# Patient Record
Sex: Female | Born: 1975 | Smoking: Never smoker
Health system: Southern US, Community
[De-identification: ages and names within clinical notes are randomized; demographics above are authoritative.]

## PROBLEM LIST (undated history)

## (undated) ENCOUNTER — Ambulatory Visit: Admission: EM | Source: Home / Self Care

---

## 2011-11-10 ENCOUNTER — Other Ambulatory Visit (HOSPITAL_COMMUNITY)
Admission: RE | Admit: 2011-11-10 | Discharge: 2011-11-10 | Disposition: A | Discharge status: Home / Self Care | Payer: BC Managed Care – PPO | Source: Ambulatory Visit | Attending: Family Medicine | Admitting: Family Medicine

## 2011-11-10 DIAGNOSIS — Z124 Encounter for screening for malignant neoplasm of cervix: Secondary | ICD-10-CM | POA: Insufficient documentation

## 2011-11-10 DIAGNOSIS — Z1159 Encounter for screening for other viral diseases: Secondary | ICD-10-CM | POA: Insufficient documentation

## 2015-09-15 ENCOUNTER — Other Ambulatory Visit (HOSPITAL_COMMUNITY)
Admission: RE | Admit: 2015-09-15 | Discharge: 2015-09-15 | Disposition: A | Discharge status: Home / Self Care | Payer: BLUE CROSS/BLUE SHIELD | Source: Ambulatory Visit | Attending: Obstetrics and Gynecology | Admitting: Obstetrics and Gynecology

## 2015-09-15 ENCOUNTER — Other Ambulatory Visit: Payer: Self-pay | Admitting: Obstetrics and Gynecology

## 2015-09-15 DIAGNOSIS — Z01419 Encounter for gynecological examination (general) (routine) without abnormal findings: Secondary | ICD-10-CM | POA: Diagnosis present

## 2015-09-15 DIAGNOSIS — Z1151 Encounter for screening for human papillomavirus (HPV): Secondary | ICD-10-CM | POA: Insufficient documentation

## 2015-09-16 LAB — CYTOLOGY - PAP

## 2017-09-20 ENCOUNTER — Other Ambulatory Visit: Payer: Self-pay | Admitting: Obstetrics and Gynecology

## 2020-04-19 ENCOUNTER — Other Ambulatory Visit (HOSPITAL_COMMUNITY)
Admission: RE | Admit: 2020-04-19 | Discharge: 2020-04-19 | Disposition: A | Discharge status: Home / Self Care | Payer: 59 | Source: Ambulatory Visit | Attending: Family Medicine | Admitting: Family Medicine

## 2020-04-19 ENCOUNTER — Other Ambulatory Visit: Payer: Self-pay | Admitting: Family Medicine

## 2020-04-19 DIAGNOSIS — Z124 Encounter for screening for malignant neoplasm of cervix: Secondary | ICD-10-CM | POA: Insufficient documentation

## 2020-04-22 ENCOUNTER — Other Ambulatory Visit: Payer: Self-pay | Admitting: Family Medicine

## 2020-04-22 DIAGNOSIS — Z1231 Encounter for screening mammogram for malignant neoplasm of breast: Secondary | ICD-10-CM

## 2020-04-23 LAB — CYTOLOGY - PAP
Comment: NEGATIVE
Diagnosis: NEGATIVE
High risk HPV: NEGATIVE

## 2020-06-17 ENCOUNTER — Ambulatory Visit
Admission: RE | Admit: 2020-06-17 | Discharge: 2020-06-17 | Disposition: A | Discharge status: Home / Self Care | Payer: BLUE CROSS/BLUE SHIELD | Source: Ambulatory Visit | Attending: Family Medicine | Admitting: Family Medicine

## 2020-06-17 ENCOUNTER — Other Ambulatory Visit: Payer: Self-pay

## 2020-06-17 DIAGNOSIS — Z1231 Encounter for screening mammogram for malignant neoplasm of breast: Secondary | ICD-10-CM

## 2021-06-27 ENCOUNTER — Other Ambulatory Visit: Payer: Self-pay | Admitting: *Deleted

## 2021-06-27 DIAGNOSIS — D509 Iron deficiency anemia, unspecified: Secondary | ICD-10-CM

## 2021-06-27 NOTE — Progress Notes (Signed)
New patient appt for 2/13 and labs. Orders for lab entered

## 2021-07-25 ENCOUNTER — Ambulatory Visit (HOSPITAL_BASED_OUTPATIENT_CLINIC_OR_DEPARTMENT_OTHER)
Admission: RE | Admit: 2021-07-25 | Discharge: 2021-07-25 | Disposition: A | Discharge status: Home / Self Care | Payer: 59 | Source: Ambulatory Visit | Attending: Nurse Practitioner | Admitting: Nurse Practitioner

## 2021-07-25 ENCOUNTER — Telehealth: Payer: Self-pay

## 2021-07-25 ENCOUNTER — Other Ambulatory Visit (HOSPITAL_BASED_OUTPATIENT_CLINIC_OR_DEPARTMENT_OTHER): Payer: Self-pay

## 2021-07-25 ENCOUNTER — Encounter: Payer: Self-pay | Admitting: Nurse Practitioner

## 2021-07-25 ENCOUNTER — Inpatient Hospital Stay: Payer: 59 | Attending: Nurse Practitioner | Admitting: Nurse Practitioner

## 2021-07-25 ENCOUNTER — Other Ambulatory Visit: Payer: Self-pay

## 2021-07-25 ENCOUNTER — Inpatient Hospital Stay: Payer: 59

## 2021-07-25 VITALS — BP 112/57 | HR 79 | Temp 97.9°F | Resp 20 | Ht 63.0 in | Wt 119.8 lb

## 2021-07-25 DIAGNOSIS — D509 Iron deficiency anemia, unspecified: Secondary | ICD-10-CM | POA: Insufficient documentation

## 2021-07-25 DIAGNOSIS — D563 Thalassemia minor: Secondary | ICD-10-CM | POA: Insufficient documentation

## 2021-07-25 DIAGNOSIS — R194 Change in bowel habit: Secondary | ICD-10-CM | POA: Insufficient documentation

## 2021-07-25 DIAGNOSIS — Z809 Family history of malignant neoplasm, unspecified: Secondary | ICD-10-CM | POA: Diagnosis not present

## 2021-07-25 DIAGNOSIS — D569 Thalassemia, unspecified: Secondary | ICD-10-CM

## 2021-07-25 LAB — SAVE SMEAR(SSMR), FOR PROVIDER SLIDE REVIEW

## 2021-07-25 LAB — CBC WITH DIFFERENTIAL (CANCER CENTER ONLY)
Abs Immature Granulocytes: 0.03 10*3/uL (ref 0.00–0.07)
Basophils Absolute: 0.1 10*3/uL (ref 0.0–0.1)
Basophils Relative: 1 %
Eosinophils Absolute: 0.1 10*3/uL (ref 0.0–0.5)
Eosinophils Relative: 2 %
HCT: 27.8 % — ABNORMAL LOW (ref 36.0–46.0)
Hemoglobin: 7.9 g/dL — ABNORMAL LOW (ref 12.0–15.0)
Immature Granulocytes: 1 %
Lymphocytes Relative: 33 %
Lymphs Abs: 2 10*3/uL (ref 0.7–4.0)
MCH: 19.5 pg — ABNORMAL LOW (ref 26.0–34.0)
MCHC: 28.4 g/dL — ABNORMAL LOW (ref 30.0–36.0)
MCV: 68.5 fL — ABNORMAL LOW (ref 80.0–100.0)
Monocytes Absolute: 0.5 10*3/uL (ref 0.1–1.0)
Monocytes Relative: 8 %
Neutro Abs: 3.4 10*3/uL (ref 1.7–7.7)
Neutrophils Relative %: 55 %
Platelet Count: 393 10*3/uL (ref 150–400)
RBC: 4.06 MIL/uL (ref 3.87–5.11)
RDW: 16.3 % — ABNORMAL HIGH (ref 11.5–15.5)
WBC Count: 6 10*3/uL (ref 4.0–10.5)
nRBC: 0 % (ref 0.0–0.2)

## 2021-07-25 LAB — URINALYSIS, COMPLETE (UACMP) WITH MICROSCOPIC
Bilirubin Urine: NEGATIVE
Glucose, UA: NEGATIVE mg/dL
Hgb urine dipstick: NEGATIVE
Ketones, ur: NEGATIVE mg/dL
Leukocytes,Ua: NEGATIVE
Nitrite: NEGATIVE
Protein, ur: NEGATIVE mg/dL
Specific Gravity, Urine: 1.005 (ref 1.005–1.030)
pH: 7 (ref 5.0–8.0)

## 2021-07-25 LAB — FERRITIN: Ferritin: 3 ng/mL — ABNORMAL LOW (ref 11–307)

## 2021-07-25 NOTE — Telephone Encounter (Signed)
Called and spoke with the patient to let her know to start ferrous sulfate 325 mg twice daily Per Ebbie Ridge. Thomas. Patient voiced understanding of instruction and had no further questions or concerns at this time.

## 2021-07-25 NOTE — Progress Notes (Signed)
New Hematology/Oncology Consult   Requesting MD: Dr. Milagros Evener  [phone]  Reason for Consult: Iron deficiency anemia  HPI: Latoya Bartlett is a 46 year old woman referred for evaluation of iron deficiency anemia.  CBC from 06/15/2021 returned with a hemoglobin of 7.9, MCV 64, RDW 16, normal white count and platelets; ferritin low at 2.5 (11-306), TIBC elevated at 590, iron low at 28, iron saturation low at 5%, transferrin elevated at 422.  She was able to access additional labs from December 2021-hemoglobin 10, MCV 76, RDW 13.9.  Past medical history: Thalassemia trait  Current Outpatient Medications:    Cyanocobalamin (VITAMIN B 12) 100 MCG LOZG, , Disp: , Rfl: :  FH: Mother is 6 with thyroid problems, father deceased in an accident, 2 brothers in good health, paternal uncle with "cancer".  SOCIAL HISTORY: She lives in [place].  She is married.  She has 2 healthy children.  She is originally from [place].  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  No tobacco or alcohol use.  Review of Systems: She is a vegan.  She feels very fatigued.  She has a monthly menstrual cycle which she describes as "light" lasting 1-1/2 to 2 days.  She is not aware of other bleeding.  Nails are brittle.  Mouth is very dry.  She intermittently has ulcers on her tongue.  No fevers or sweats.  No weight loss.  Recent joint pains, mainly knees.  She has dyspnea on exertion.  No cough.  No dysphagia.  No nausea or vomiting.  3 to 4 months ago she began having a bowel movement after eating with stool watery on an intermittent basis.  This occurs after at least 2 meals a day.  No black stools.  She does note that stools have developed a "foul odor".  By the end of the day abdomen feels swollen.  She has never had a colonoscopy.  No hematuria.  Physical Exam:  Blood pressure (!) 112/57, pulse 79, temperature 97.9 F (36.6 C), temperature source Oral, resp. rate 20, height 5\' 3"  (1.6 m), weight 119 lb 12.8 oz (54.3 kg),  SpO2 98 %.  HEENT: Conjunctival pallor.  No thrush or ulcers. Lungs: Lungs clear bilaterally. Cardiac: Regular rate and rhythm. Abdomen: Abdomen soft and nontender.  Abdomen seems distended.  No hepatosplenomegaly. Vascular: No leg edema. Lymph nodes: No palpable cervical, supraclavicular, axillary or inguinal lymph nodes. Neurologic: Alert and oriented. Skin: Warm and dry.  No rash.  LABS:   Recent Labs    07/25/21 1343  WBC 6.0  HGB 7.9*  HCT 27.8*  PLT 393  Peripheral blood smear-few "C" cells, numerous ovalocytes, few teardrops, marked variation in red cell size, hypochromic and microcytic red cells, rare cigar cell, rare target cells; white blood cell morphology unremarkable; platelets normal in number  Assessment and Plan:   Microcytic anemia due to iron deficiency, thalassemia likely contributing Thalassemia Change in bowel habits 3 to 4 months ago  Latoya Bartlett has been referred for evaluation of iron deficiency anemia.  She has a microcytic anemia.  Iron studies are consistent with iron deficiency.  She also reports having thalassemia which is likely contributing.  She will begin ferrous sulfate 325 mg twice daily.  She will contact the office if she is unable to tolerate the oral iron.  There is no obvious source of blood loss.  Her menstrual cycle is very light.  She will complete stool cards and submit a urinalysis.  We will obtain a hemoglobin electrophoresis.  At  today's visit she reports a change in bowel habits 3 to 4 months ago.  On exam the abdomen seems distended.  We are sending her for an abdominal x-ray.  We made a referral to Dr. Therisa Doyne for further evaluation.  She will return for lab and follow-up in approximately 4 weeks.  She will contact the office in the interim as outlined above or with any other problems.  Patient seen with Dr. Benay Spice.    Ned Card, NP 07/25/2021, 2:45 PM   This was a shared visit with Ned Card.  46s. Bartlett was  interviewed and examined.  I reviewed the peripheral blood smear.  She is referred for evaluation of microcytic anemia.  She has iron deficiency anemia.  She reports a history of "thalassemia" trait, but we do not see significant targeting on the blood smear today.  We will check a hemoglobin electrophoresis.  She reports irregular bowel habits for the past 6 months.  I am concerned she could have chronic GI blood loss.  We will refer her to Dr. Deno Etienne to consider an endoscopic evaluation.  We will refer her for an abdominal x-ray to evaluate the abdominal distention.  I was present for greater than 50% today's visit.  I performed medical decision making.  Latoya Manson, MD

## 2021-07-25 NOTE — Progress Notes (Signed)
Urgent referral placed to [facility] with Dr Marca Ancona per Lonna Cobb NP, voicemail on referral coordinator for [facility] and will fax referral this afternoon to [phone]

## 2021-07-27 ENCOUNTER — Encounter: Payer: Self-pay | Admitting: *Deleted

## 2021-07-27 DIAGNOSIS — D509 Iron deficiency anemia, unspecified: Secondary | ICD-10-CM | POA: Diagnosis not present

## 2021-07-27 DIAGNOSIS — R194 Change in bowel habit: Secondary | ICD-10-CM | POA: Diagnosis not present

## 2021-07-27 DIAGNOSIS — D563 Thalassemia minor: Secondary | ICD-10-CM | POA: Insufficient documentation

## 2021-07-27 LAB — HGB FRACTIONATION CASCADE
Hgb A2: 1.9 % (ref 1.8–3.2)
Hgb A: 98.1 % (ref 96.4–98.8)
Hgb F: 0 % (ref 0.0–2.0)
Hgb S: 0 %

## 2021-07-27 NOTE — Progress Notes (Signed)
Faxed demographics, chart information to [facility] for urgent referral [phone].

## 2021-07-28 ENCOUNTER — Telehealth: Payer: Self-pay | Admitting: *Deleted

## 2021-07-28 DIAGNOSIS — D509 Iron deficiency anemia, unspecified: Secondary | ICD-10-CM | POA: Diagnosis not present

## 2021-07-29 DIAGNOSIS — D509 Iron deficiency anemia, unspecified: Secondary | ICD-10-CM | POA: Diagnosis not present

## 2021-08-01 ENCOUNTER — Other Ambulatory Visit (HOSPITAL_BASED_OUTPATIENT_CLINIC_OR_DEPARTMENT_OTHER): Payer: Self-pay

## 2021-08-01 DIAGNOSIS — D509 Iron deficiency anemia, unspecified: Secondary | ICD-10-CM

## 2021-08-01 LAB — OCCULT BLOOD X 1 CARD TO LAB, STOOL
Fecal Occult Bld: NEGATIVE
Fecal Occult Bld: NEGATIVE
Fecal Occult Bld: NEGATIVE

## 2021-08-04 NOTE — Telephone Encounter (Signed)
Verified that patient has appt with [facility] with Dr Marca Ancona on 08/10/21

## 2021-08-22 ENCOUNTER — Inpatient Hospital Stay: Payer: 59 | Attending: Oncology | Admitting: Oncology

## 2021-08-22 ENCOUNTER — Inpatient Hospital Stay (HOSPITAL_BASED_OUTPATIENT_CLINIC_OR_DEPARTMENT_OTHER): Payer: 59 | Admitting: Oncology

## 2021-08-22 ENCOUNTER — Other Ambulatory Visit: Payer: Self-pay

## 2021-08-22 VITALS — BP 98/64 | HR 77 | Temp 98.0°F | Resp 20 | Ht 63.0 in | Wt 119.2 lb

## 2021-08-22 DIAGNOSIS — D509 Iron deficiency anemia, unspecified: Secondary | ICD-10-CM | POA: Insufficient documentation

## 2021-08-22 DIAGNOSIS — D569 Thalassemia, unspecified: Secondary | ICD-10-CM | POA: Insufficient documentation

## 2021-08-22 DIAGNOSIS — R194 Change in bowel habit: Secondary | ICD-10-CM | POA: Insufficient documentation

## 2021-08-22 LAB — CBC WITH DIFFERENTIAL (CANCER CENTER ONLY)
Abs Immature Granulocytes: 0.01 10*3/uL (ref 0.00–0.07)
Basophils Absolute: 0.1 10*3/uL (ref 0.0–0.1)
Basophils Relative: 1 %
Eosinophils Absolute: 0.1 10*3/uL (ref 0.0–0.5)
Eosinophils Relative: 2 %
HCT: 32.6 % — ABNORMAL LOW (ref 36.0–46.0)
Hemoglobin: 9.5 g/dL — ABNORMAL LOW (ref 12.0–15.0)
Immature Granulocytes: 0 %
Lymphocytes Relative: 35 %
Lymphs Abs: 1.7 10*3/uL (ref 0.7–4.0)
MCH: 21 pg — ABNORMAL LOW (ref 26.0–34.0)
MCHC: 29.1 g/dL — ABNORMAL LOW (ref 30.0–36.0)
MCV: 72.1 fL — ABNORMAL LOW (ref 80.0–100.0)
Monocytes Absolute: 0.5 10*3/uL (ref 0.1–1.0)
Monocytes Relative: 10 %
Neutro Abs: 2.4 10*3/uL (ref 1.7–7.7)
Neutrophils Relative %: 52 %
Platelet Count: 402 10*3/uL — ABNORMAL HIGH (ref 150–400)
RBC: 4.52 MIL/uL (ref 3.87–5.11)
RDW: 22.2 % — ABNORMAL HIGH (ref 11.5–15.5)
WBC Count: 4.7 10*3/uL (ref 4.0–10.5)
nRBC: 0 % (ref 0.0–0.2)

## 2021-08-22 LAB — FERRITIN: Ferritin: 8 ng/mL — ABNORMAL LOW (ref 11–307)

## 2021-08-22 NOTE — Progress Notes (Signed)
?  [facility] ?OFFICE PROGRESS NOTE ? ? ?Diagnosis: Anemia ? ?INTERVAL HISTORY:  ? ?Latoya Bartlett returns as scheduled.  She is taking iron once daily.  She tolerated khaki and is scheduled for upper and lower endoscopies later this week.  She reports partial improvement in malaise since beginning iron.  She continues to have a menstrual cycle.  No other bleeding.  She has abdominal distention prior to menses. ? ?Objective: ? ?Vital signs in last 24 hours: ? ?Blood pressure 98/64, pulse 77, temperature 98 ?F (36.7 ?C), temperature source Oral, resp. rate 20, height 5\' 3"  (1.6 m), weight 119 lb 3.2 oz (54.1 kg), SpO2 100 %. ?  ? ?Resp: Lungs clear bilaterally ?Cardio: Regular rate and rhythm ?GI: No hepatosplenomegaly, nontender ?Vascular: No leg edema ? ? ?Lab Results: ? ?Lab Results  ?Component Value Date  ? WBC 4.7 08/22/2021  ? HGB 9.5 (L) 08/22/2021  ? HCT 32.6 (L) 08/22/2021  ? MCV 72.1 (L) 08/22/2021  ? PLT 402 (H) 08/22/2021  ? NEUTROABS 2.4 08/22/2021  ? ? ? ?Medications: I have reviewed the patient's current medications. ? ? ?Assessment/Plan: ?Microcytic anemia due to iron deficiency, thalassemia likely contributing ?Thalassemia ?Change in bowel habits 3 to 4 months ago-evaluated by Dr. Deno Etienne and scheduled for upper and lower endoscopies ? ? ? ?Disposition: ?The hemoglobin has improved since she began iron therapy.  She will try to increase the iron to twice daily.  [name] will return for an office visit and CBC in 2 months. ? ?She reports a family member has alpha thalassemia.  The hemoglobin electrophoresis 07/25/2021 did not reveal an elevated hemoglobin A 2 level.  We will check alpha globin gene testing when she returns in 2 months. ? ?Betsy Coder, MD ? ?08/22/2021  ?8:16 AM ? ? ?

## 2021-08-26 ENCOUNTER — Telehealth: Payer: Self-pay

## 2021-08-26 NOTE — Telephone Encounter (Signed)
-----   Message from Ladene Artist, MD sent at 08/25/2021  8:06 PM EDT ----- ?Please call patient, iron level is still low, increase ferrous sulfate to twice daily ? ?

## 2021-08-26 NOTE — Progress Notes (Addendum)
TC to Pt no answer v/m not able to leave message. Will try again today. Second call to Pt. Pt verbalized understanding. ?

## 2021-08-26 NOTE — Telephone Encounter (Signed)
Verbal read back of instructions to take iron twice daily. Pt verbalized understanding. ?

## 2021-08-29 ENCOUNTER — Telehealth: Payer: Self-pay

## 2021-09-29 ENCOUNTER — Telehealth: Payer: Self-pay

## 2021-09-29 ENCOUNTER — Other Ambulatory Visit: Payer: Self-pay

## 2021-09-29 DIAGNOSIS — D509 Iron deficiency anemia, unspecified: Secondary | ICD-10-CM

## 2021-09-29 NOTE — Telephone Encounter (Signed)
TC from Pt stating she is having shortness of breath and loss of voice since she has been taking ferrous sulfate 325 mg twice daily. Pt stated she stopped taking the medication and wanted to inform Dr Truett Perna discussed with provider who stated he doesn't think her losing her voice or shortness of breath is from the iron. Lab appointment scheduled to check Pt's HGB.  ?

## 2021-09-30 ENCOUNTER — Inpatient Hospital Stay: Payer: 59 | Attending: Oncology

## 2021-09-30 DIAGNOSIS — D509 Iron deficiency anemia, unspecified: Secondary | ICD-10-CM | POA: Diagnosis present

## 2021-09-30 LAB — CBC WITH DIFFERENTIAL (CANCER CENTER ONLY)
Abs Immature Granulocytes: 0.01 10*3/uL (ref 0.00–0.07)
Basophils Absolute: 0.1 10*3/uL (ref 0.0–0.1)
Basophils Relative: 1 %
Eosinophils Absolute: 0.1 10*3/uL (ref 0.0–0.5)
Eosinophils Relative: 2 %
HCT: 33.6 % — ABNORMAL LOW (ref 36.0–46.0)
Hemoglobin: 10.3 g/dL — ABNORMAL LOW (ref 12.0–15.0)
Immature Granulocytes: 0 %
Lymphocytes Relative: 32 %
Lymphs Abs: 1.8 10*3/uL (ref 0.7–4.0)
MCH: 23.8 pg — ABNORMAL LOW (ref 26.0–34.0)
MCHC: 30.7 g/dL (ref 30.0–36.0)
MCV: 77.8 fL — ABNORMAL LOW (ref 80.0–100.0)
Monocytes Absolute: 0.5 10*3/uL (ref 0.1–1.0)
Monocytes Relative: 9 %
Neutro Abs: 3.1 10*3/uL (ref 1.7–7.7)
Neutrophils Relative %: 56 %
Platelet Count: 298 10*3/uL (ref 150–400)
RBC: 4.32 MIL/uL (ref 3.87–5.11)
RDW: 20.5 % — ABNORMAL HIGH (ref 11.5–15.5)
WBC Count: 5.5 10*3/uL (ref 4.0–10.5)
nRBC: 0 % (ref 0.0–0.2)

## 2021-10-07 ENCOUNTER — Telehealth: Payer: Self-pay

## 2021-10-07 NOTE — Telephone Encounter (Signed)
Pt called requesting information about lab results lab results giving Pt inquiring about IV iron instead of taking oral iron. Dr. Truett Perna informed. ?

## 2021-10-18 NOTE — Addendum Note (Signed)
Addended by: Tyson Dense B on: 10/18/2021 01:52 PM ? ? Modules accepted: Orders ? ?

## 2021-10-25 ENCOUNTER — Encounter: Payer: Self-pay | Admitting: Nurse Practitioner

## 2021-10-25 ENCOUNTER — Inpatient Hospital Stay: Payer: 59 | Attending: Oncology

## 2021-10-25 ENCOUNTER — Inpatient Hospital Stay (HOSPITAL_BASED_OUTPATIENT_CLINIC_OR_DEPARTMENT_OTHER): Payer: 59 | Admitting: Nurse Practitioner

## 2021-10-25 VITALS — BP 117/58 | HR 78 | Temp 98.1°F | Resp 18 | Ht 63.0 in | Wt 122.8 lb

## 2021-10-25 DIAGNOSIS — R194 Change in bowel habit: Secondary | ICD-10-CM | POA: Insufficient documentation

## 2021-10-25 DIAGNOSIS — D509 Iron deficiency anemia, unspecified: Secondary | ICD-10-CM | POA: Insufficient documentation

## 2021-10-25 DIAGNOSIS — D569 Thalassemia, unspecified: Secondary | ICD-10-CM | POA: Insufficient documentation

## 2021-10-25 LAB — CBC WITH DIFFERENTIAL (CANCER CENTER ONLY)
Abs Immature Granulocytes: 0.01 10*3/uL (ref 0.00–0.07)
Basophils Absolute: 0 10*3/uL (ref 0.0–0.1)
Basophils Relative: 1 %
Eosinophils Absolute: 0.1 10*3/uL (ref 0.0–0.5)
Eosinophils Relative: 2 %
HCT: 32.3 % — ABNORMAL LOW (ref 36.0–46.0)
Hemoglobin: 10 g/dL — ABNORMAL LOW (ref 12.0–15.0)
Immature Granulocytes: 0 %
Lymphocytes Relative: 34 %
Lymphs Abs: 1.9 10*3/uL (ref 0.7–4.0)
MCH: 24.6 pg — ABNORMAL LOW (ref 26.0–34.0)
MCHC: 31 g/dL (ref 30.0–36.0)
MCV: 79.4 fL — ABNORMAL LOW (ref 80.0–100.0)
Monocytes Absolute: 0.5 10*3/uL (ref 0.1–1.0)
Monocytes Relative: 9 %
Neutro Abs: 3 10*3/uL (ref 1.7–7.7)
Neutrophils Relative %: 54 %
Platelet Count: 303 10*3/uL (ref 150–400)
RBC: 4.07 MIL/uL (ref 3.87–5.11)
RDW: 16.2 % — ABNORMAL HIGH (ref 11.5–15.5)
WBC Count: 5.5 10*3/uL (ref 4.0–10.5)
nRBC: 0 % (ref 0.0–0.2)

## 2021-10-25 LAB — FERRITIN: Ferritin: 4 ng/mL — ABNORMAL LOW (ref 11–307)

## 2021-10-25 NOTE — Progress Notes (Signed)
?  [facility] ?OFFICE PROGRESS NOTE ? ? ?Diagnosis: Anemia ? ?INTERVAL HISTORY:  ? ?Ms. Maggi returns as scheduled.  She is no longer taking oral iron.  She contacted the office on 09/29/2021 to report shortness of breath and loss of voice.  She attributed the symptoms to oral iron.  She has noted some improvement but continues to have hoarseness.  She has seen ENT for the hoarseness predating the start of oral iron.  She feels the oral iron worsened the hoarseness.  She continues to have a menstrual cycle.  No other bleeding. ? ? ? ?Objective: ? ?Vital signs in last 24 hours: ? ?Blood pressure (!) 117/58, pulse 78, temperature 98.1 ?F (36.7 ?C), temperature source Oral, resp. rate 18, height 5\' 3"  (1.6 m), weight 122 lb 12.8 oz (55.7 kg), SpO2 100 %. ?  ? ?Resp: Lungs clear bilaterally. ?Cardio: Regular rate and rhythm. ?GI: Abdomen soft and nontender.  No hepatosplenomegaly. ?Vascular: No leg edema. ? ? ?Lab Results: ? ?Lab Results  ?Component Value Date  ? WBC 5.5 10/25/2021  ? HGB 10.0 (L) 10/25/2021  ? HCT 32.3 (L) 10/25/2021  ? MCV 79.4 (L) 10/25/2021  ? PLT 303 10/25/2021  ? NEUTROABS 3.0 10/25/2021  ? ? ?Imaging: ? ?No results found. ? ?Medications: I have reviewed the patient's current medications. ? ?Assessment/Plan: ?Microcytic anemia due to iron deficiency, thalassemia likely contributing ?Thalassemia ?Change in bowel habits 3 to 4 months ago-evaluated by Dr. 10/27/2021, underwent upper and lower endoscopy ? ?Disposition: Ms. Mckinlay has a microcytic anemia due to iron deficiency, thalassemia likely contributing.  She discontinued oral iron several weeks ago due to concern regarding increased hoarseness and shortness of breath.  We discussed a different oral iron preparation versus a trial of IV iron.  We discussed the risk for an allergic or anaphylactic reaction with IV iron.  She is uncomfortable taking oral iron or IV iron.  She will try dietary manipulations.  We will follow-up on  the [place] globin gene testing from today.  She will return for a CBC and follow-up visit in 4 weeks. ? ?She will continue follow-up with ENT regarding the hoarseness. ? ? ? ?Noralee Space ANP/GNP-BC  ? ?10/25/2021  ?8:18 AM ? ? ? ? ? ? ? ?

## 2021-11-03 LAB — ALPHA-THALASSEMIA GENOTYPR

## 2021-11-25 ENCOUNTER — Other Ambulatory Visit: Payer: Self-pay | Admitting: *Deleted

## 2021-11-25 DIAGNOSIS — D509 Iron deficiency anemia, unspecified: Secondary | ICD-10-CM

## 2021-11-28 ENCOUNTER — Inpatient Hospital Stay: Payer: 59 | Attending: Oncology

## 2021-11-28 ENCOUNTER — Inpatient Hospital Stay (HOSPITAL_BASED_OUTPATIENT_CLINIC_OR_DEPARTMENT_OTHER): Payer: 59 | Admitting: Oncology

## 2021-11-28 ENCOUNTER — Other Ambulatory Visit: Payer: Self-pay

## 2021-11-28 VITALS — BP 108/69 | HR 82 | Temp 98.2°F | Resp 18 | Ht 63.0 in | Wt 120.4 lb

## 2021-11-28 DIAGNOSIS — D509 Iron deficiency anemia, unspecified: Secondary | ICD-10-CM

## 2021-11-28 DIAGNOSIS — R194 Change in bowel habit: Secondary | ICD-10-CM | POA: Diagnosis not present

## 2021-11-28 DIAGNOSIS — D563 Thalassemia minor: Secondary | ICD-10-CM | POA: Insufficient documentation

## 2021-11-28 LAB — CBC WITH DIFFERENTIAL (CANCER CENTER ONLY)
Abs Immature Granulocytes: 0.02 10*3/uL (ref 0.00–0.07)
Basophils Absolute: 0 10*3/uL (ref 0.0–0.1)
Basophils Relative: 1 %
Eosinophils Absolute: 0.1 10*3/uL (ref 0.0–0.5)
Eosinophils Relative: 1 %
HCT: 33.5 % — ABNORMAL LOW (ref 36.0–46.0)
Hemoglobin: 10.4 g/dL — ABNORMAL LOW (ref 12.0–15.0)
Immature Granulocytes: 0 %
Lymphocytes Relative: 28 %
Lymphs Abs: 1.8 10*3/uL (ref 0.7–4.0)
MCH: 24.8 pg — ABNORMAL LOW (ref 26.0–34.0)
MCHC: 31 g/dL (ref 30.0–36.0)
MCV: 79.8 fL — ABNORMAL LOW (ref 80.0–100.0)
Monocytes Absolute: 0.5 10*3/uL (ref 0.1–1.0)
Monocytes Relative: 7 %
Neutro Abs: 4 10*3/uL (ref 1.7–7.7)
Neutrophils Relative %: 63 %
Platelet Count: 370 10*3/uL (ref 150–400)
RBC: 4.2 MIL/uL (ref 3.87–5.11)
RDW: 13.4 % (ref 11.5–15.5)
WBC Count: 6.4 10*3/uL (ref 4.0–10.5)
nRBC: 0 % (ref 0.0–0.2)

## 2021-11-28 LAB — FERRITIN: Ferritin: 8 ng/mL — ABNORMAL LOW (ref 11–307)

## 2021-11-28 NOTE — Progress Notes (Signed)
  [facility] OFFICE PROGRESS NOTE   Diagnosis: Iron deficiency anemia  INTERVAL HISTORY:   Latoya Bartlett returns as scheduled.  She is not taking iron.  She has a menstrual cycle.  No other bleeding.  She had a "cold "a fever, sinus congestion, and cough last week.  The symptoms have improved.  No dyspnea.  Objective:  Vital signs in last 24 hours:  Blood pressure 108/69, pulse 82, temperature 98.2 F (36.8 C), temperature source Oral, resp. rate 18, height 5\' 3"  (1.6 m), weight 120 lb 6.4 oz (54.6 kg), SpO2 100 %.    Resp: Lungs clear bilaterally Cardio: Regular rate and rhythm GI: No hepatosplenomegaly, nontender Vascular: No leg edema     Lab Results:  Lab Results  Component Value Date   WBC 6.4 11/28/2021   HGB 10.4 (L) 11/28/2021   HCT 33.5 (L) 11/28/2021   MCV 79.8 (L) 11/28/2021   PLT 370 11/28/2021   NEUTROABS 4.0 11/28/2021   Medications: I have reviewed the patient's current medications.   Assessment/Plan: Microcytic anemia due to iron deficiency Report of thalassemia trait [place] globin gene testing - 10/25/2021 Change in bowel habits 3 to 4 months ago-evaluated by Dr. 10/27/2021, underwent upper and lower endoscopy    Disposition: Latoya Bartlett has persistent microcytic anemia, though the hemoglobin has improved over the past several months.  The anemia appears to be related to iron deficiency.  We will follow-up on the ferritin level from today.  She agrees to resume ferrous sulfate twice daily.  She developed hoarseness when on iron in the past.  This has resolved.  She will call for recurrent hoarseness.  She will return for an office and lab visit in 6 weeks.  Noralee Space, MD  11/28/2021  9:08 AM

## 2021-11-30 ENCOUNTER — Telehealth: Payer: Self-pay

## 2021-11-30 NOTE — Progress Notes (Signed)
Two calls to Pt. To relay results not able to leave message voicemail not set up

## 2021-11-30 NOTE — Telephone Encounter (Signed)
Called the patient to give result

## 2021-12-01 ENCOUNTER — Telehealth: Payer: Self-pay

## 2021-12-01 NOTE — Telephone Encounter (Signed)
-----   Message from Ladene Artist, MD sent at 11/28/2021  5:20 PM EDT ----- Please call patient, iron level remains low, begin ferrous sulfate as scheduled, follow-up as scheduled

## 2021-12-01 NOTE — Telephone Encounter (Signed)
Pt verbalized understanding.

## 2021-12-07 LAB — ALPHA-THALASSEMIA GENOTYPR

## 2022-01-09 ENCOUNTER — Inpatient Hospital Stay: Payer: 59 | Attending: Oncology

## 2022-01-09 ENCOUNTER — Inpatient Hospital Stay: Payer: 59 | Admitting: Nurse Practitioner

## 2022-01-10 ENCOUNTER — Encounter: Payer: Self-pay | Admitting: Nurse Practitioner

## 2022-01-10 ENCOUNTER — Inpatient Hospital Stay: Payer: 59 | Attending: Oncology | Admitting: Nurse Practitioner

## 2022-01-10 ENCOUNTER — Inpatient Hospital Stay: Payer: 59

## 2022-01-10 VITALS — BP 109/73 | HR 74 | Temp 97.9°F | Resp 18 | Ht 63.0 in | Wt 119.8 lb

## 2022-01-10 DIAGNOSIS — D509 Iron deficiency anemia, unspecified: Secondary | ICD-10-CM | POA: Diagnosis present

## 2022-01-10 DIAGNOSIS — K5900 Constipation, unspecified: Secondary | ICD-10-CM | POA: Insufficient documentation

## 2022-01-10 LAB — CBC WITH DIFFERENTIAL (CANCER CENTER ONLY)
Abs Immature Granulocytes: 0.03 10*3/uL (ref 0.00–0.07)
Basophils Absolute: 0.1 10*3/uL (ref 0.0–0.1)
Basophils Relative: 1 %
Eosinophils Absolute: 0.1 10*3/uL (ref 0.0–0.5)
Eosinophils Relative: 2 %
HCT: 36.6 % (ref 36.0–46.0)
Hemoglobin: 11.6 g/dL — ABNORMAL LOW (ref 12.0–15.0)
Immature Granulocytes: 0 %
Lymphocytes Relative: 21 %
Lymphs Abs: 1.6 10*3/uL (ref 0.7–4.0)
MCH: 26.4 pg (ref 26.0–34.0)
MCHC: 31.7 g/dL (ref 30.0–36.0)
MCV: 83.4 fL (ref 80.0–100.0)
Monocytes Absolute: 0.6 10*3/uL (ref 0.1–1.0)
Monocytes Relative: 7 %
Neutro Abs: 5.4 10*3/uL (ref 1.7–7.7)
Neutrophils Relative %: 69 %
Platelet Count: 361 10*3/uL (ref 150–400)
RBC: 4.39 MIL/uL (ref 3.87–5.11)
RDW: 14.5 % (ref 11.5–15.5)
WBC Count: 7.8 10*3/uL (ref 4.0–10.5)
nRBC: 0 % (ref 0.0–0.2)

## 2022-01-10 LAB — FERRITIN: Ferritin: 18 ng/mL (ref 11–307)

## 2022-01-10 NOTE — Progress Notes (Signed)
  [facility] OFFICE PROGRESS NOTE   Diagnosis: Iron deficiency anemia  INTERVAL HISTORY:   Latoya Bartlett returns as scheduled.  She tried taking oral iron twice a day following office visit 11/28/2021.  She was unable to tolerate due to constipation.  She is able to tolerate 1 iron tablet a day.  No bleeding except related to the monthly menstrual cycle.  She did not develop recurrent hoarseness when she resumed oral iron.  She notes that she is feeling better coinciding with improvement in the hemoglobin.  Objective:  Vital signs in last 24 hours:  Blood pressure 109/73, pulse 74, temperature 97.9 F (36.6 C), temperature source Oral, resp. rate 18, height 5\' 3"  (1.6 m), weight 119 lb 12.8 oz (54.3 kg), SpO2 100 %.    HEENT: No thrush or ulcers. Resp: Lungs clear bilaterally. Cardio: Regular rate and rhythm. GI: Abdomen soft and nontender.  No hepatosplenomegaly. Vascular: No leg edema.  Lab Results:  Lab Results  Component Value Date   WBC 7.8 01/10/2022   HGB 11.6 (L) 01/10/2022   HCT 36.6 01/10/2022   MCV 83.4 01/10/2022   PLT 361 01/10/2022   NEUTROABS 5.4 01/10/2022    Imaging:  No results found.  Medications: I have reviewed the patient's current medications.  Assessment/Plan: Microcytic anemia due to iron deficiency 07/25/2021 urinalysis negative for hemoglobin 07/27/2021 stool cards negative for occult blood Report of thalassemia trait [place] globin gene testing - 10/25/2021 no deletions detected Change in bowel habits 3 to 4 months ago-evaluated by Dr. 10/27/2021, underwent upper and lower endoscopy  Disposition: Latoya Bartlett appears stable.  The hemoglobin has improved with oral iron.  We will follow-up on the ferritin from today.  She will continue oral iron once daily.  She will return for lab and follow-up in approximately 6 weeks.    Noralee Space ANP/GNP-BC   01/10/2022  9:10 AM

## 2022-02-27 ENCOUNTER — Ambulatory Visit: Payer: 59 | Admitting: Oncology

## 2022-02-27 ENCOUNTER — Other Ambulatory Visit: Payer: 59

## 2022-02-27 ENCOUNTER — Ambulatory Visit: Payer: 59 | Admitting: Nurse Practitioner

## 2022-03-06 ENCOUNTER — Inpatient Hospital Stay: Payer: 59 | Attending: Oncology | Admitting: Nurse Practitioner

## 2022-03-06 ENCOUNTER — Inpatient Hospital Stay: Payer: 59

## 2022-03-06 ENCOUNTER — Encounter: Payer: Self-pay | Admitting: Nurse Practitioner

## 2022-03-06 VITALS — BP 109/66 | HR 86 | Temp 98.1°F | Resp 20 | Ht 63.0 in | Wt 119.6 lb

## 2022-03-06 DIAGNOSIS — D563 Thalassemia minor: Secondary | ICD-10-CM | POA: Diagnosis not present

## 2022-03-06 DIAGNOSIS — D509 Iron deficiency anemia, unspecified: Secondary | ICD-10-CM | POA: Diagnosis present

## 2022-03-06 LAB — CBC WITH DIFFERENTIAL (CANCER CENTER ONLY)
Abs Immature Granulocytes: 0.01 10*3/uL (ref 0.00–0.07)
Basophils Absolute: 0 10*3/uL (ref 0.0–0.1)
Basophils Relative: 1 %
Eosinophils Absolute: 0.1 10*3/uL (ref 0.0–0.5)
Eosinophils Relative: 2 %
HCT: 34.9 % — ABNORMAL LOW (ref 36.0–46.0)
Hemoglobin: 11.5 g/dL — ABNORMAL LOW (ref 12.0–15.0)
Immature Granulocytes: 0 %
Lymphocytes Relative: 26 %
Lymphs Abs: 1.7 10*3/uL (ref 0.7–4.0)
MCH: 27.5 pg (ref 26.0–34.0)
MCHC: 33 g/dL (ref 30.0–36.0)
MCV: 83.5 fL (ref 80.0–100.0)
Monocytes Absolute: 0.4 10*3/uL (ref 0.1–1.0)
Monocytes Relative: 7 %
Neutro Abs: 4.1 10*3/uL (ref 1.7–7.7)
Neutrophils Relative %: 64 %
Platelet Count: 323 10*3/uL (ref 150–400)
RBC: 4.18 MIL/uL (ref 3.87–5.11)
RDW: 13.1 % (ref 11.5–15.5)
WBC Count: 6.3 10*3/uL (ref 4.0–10.5)
nRBC: 0 % (ref 0.0–0.2)

## 2022-03-06 LAB — FERRITIN: Ferritin: 16 ng/mL (ref 11–307)

## 2022-03-06 NOTE — Progress Notes (Signed)
  [facility] OFFICE PROGRESS NOTE   Diagnosis: Iron deficiency anemia  INTERVAL HISTORY:   Latoya Bartlett returns as scheduled.  She continues oral iron once daily.  She denies constipation.  No nausea or vomiting.  She has developed recurrent hoarseness.  Only bleeding is the menstrual cycle which she describes as fairly light.  Objective:  Vital signs in last 24 hours:  Blood pressure 109/66, pulse 86, temperature 98.1 F (36.7 C), temperature source Oral, resp. rate 20, height 5\' 3"  (1.6 m), weight 119 lb 9.6 oz (54.3 kg), SpO2 100 %.    HEENT: No thrush or ulcers. Resp: Lungs clear bilaterally. Cardio: Regular rate and rhythm. GI: Abdomen soft and nontender.  No hepatosplenomegaly. Vascular: No leg edema.   Lab Results:  Lab Results  Component Value Date   WBC 6.3 03/06/2022   HGB 11.5 (L) 03/06/2022   HCT 34.9 (L) 03/06/2022   MCV 83.5 03/06/2022   PLT 323 03/06/2022   NEUTROABS 4.1 03/06/2022    Imaging:  No results found.  Medications: I have reviewed the patient's current medications.  Assessment/Plan: Microcytic anemia due to iron deficiency 07/25/2021 urinalysis negative for hemoglobin 07/27/2021 stool cards negative for occult blood Report of thalassemia trait [place] globin gene testing - 10/25/2021 no deletions detected Change in bowel habits 3 to 4 months ago-evaluated by Dr. Therisa Doyne, underwent upper and lower endoscopy  Disposition: Latoya Bartlett appears stable.  Hemoglobin has partially corrected with oral iron.  MCV is now in normal range.  She will continue oral iron once daily.  We will follow-up on the ferritin from today.  We discussed the hoarseness which was present prior to beginning oral iron, recently recurred.  She will follow-up with ENT.  She will return for lab and follow-up in 2 months.  We are available to see her sooner if needed.    Ned Card ANP/GNP-BC   03/06/2022  12:23 PM

## 2022-03-07 ENCOUNTER — Telehealth: Payer: Self-pay

## 2022-03-07 NOTE — Telephone Encounter (Signed)
Patient gave verbal understanding had no further questions or concerns at this time 

## 2022-03-07 NOTE — Telephone Encounter (Signed)
-----   Message from Owens Shark, NP sent at 03/06/2022  1:30 PM EDT ----- Please let her know ferritin is stable in low normal range, continue oral iron, follow-up as scheduled.

## 2022-05-01 ENCOUNTER — Inpatient Hospital Stay (HOSPITAL_BASED_OUTPATIENT_CLINIC_OR_DEPARTMENT_OTHER): Payer: 59 | Admitting: Oncology

## 2022-05-01 ENCOUNTER — Inpatient Hospital Stay: Payer: 59 | Attending: Oncology

## 2022-05-01 VITALS — BP 109/49 | HR 62 | Temp 98.1°F | Resp 16 | Wt 122.2 lb

## 2022-05-01 DIAGNOSIS — D509 Iron deficiency anemia, unspecified: Secondary | ICD-10-CM | POA: Diagnosis present

## 2022-05-01 DIAGNOSIS — D563 Thalassemia minor: Secondary | ICD-10-CM | POA: Diagnosis not present

## 2022-05-01 LAB — CBC WITH DIFFERENTIAL (CANCER CENTER ONLY)
Abs Immature Granulocytes: 0.02 10*3/uL (ref 0.00–0.07)
Basophils Absolute: 0 10*3/uL (ref 0.0–0.1)
Basophils Relative: 1 %
Eosinophils Absolute: 0.1 10*3/uL (ref 0.0–0.5)
Eosinophils Relative: 2 %
HCT: 33 % — ABNORMAL LOW (ref 36.0–46.0)
Hemoglobin: 10.7 g/dL — ABNORMAL LOW (ref 12.0–15.0)
Immature Granulocytes: 0 %
Lymphocytes Relative: 33 %
Lymphs Abs: 2 10*3/uL (ref 0.7–4.0)
MCH: 27.6 pg (ref 26.0–34.0)
MCHC: 32.4 g/dL (ref 30.0–36.0)
MCV: 85.1 fL (ref 80.0–100.0)
Monocytes Absolute: 0.5 10*3/uL (ref 0.1–1.0)
Monocytes Relative: 8 %
Neutro Abs: 3.3 10*3/uL (ref 1.7–7.7)
Neutrophils Relative %: 56 %
Platelet Count: 323 10*3/uL (ref 150–400)
RBC: 3.88 MIL/uL (ref 3.87–5.11)
RDW: 12.4 % (ref 11.5–15.5)
WBC Count: 6 10*3/uL (ref 4.0–10.5)
nRBC: 0 % (ref 0.0–0.2)

## 2022-05-01 LAB — FERRITIN: Ferritin: 14 ng/mL (ref 11–307)

## 2022-05-01 NOTE — Progress Notes (Signed)
  [facility] OFFICE PROGRESS NOTE   Diagnosis: Iron deficiency anemia  INTERVAL HISTORY:   Ms. Stegman returns as scheduled.  She is taking iron once daily, but does not take the iron at times.  The menstrual cycle is lasting longer recently.  She reports having a menstrual cycle for 6-7 days.  She has fatigue in the mornings.  This improves during the day.  She reports developing a "blackout "of her vision for a few seconds intermittently.  No syncope.  Objective:  Vital signs in last 24 hours:  Blood pressure (!) 109/49, pulse 62, temperature 98.1 F (36.7 C), temperature source Temporal, resp. rate 16, weight 122 lb 3.2 oz (55.4 kg), SpO2 97 %.     Resp: Lungs clear bilaterally Cardio: Regular rate and rhythm GI: Nontender, no hepatosplenomegaly, no mass Vascular: No leg edema   Lab Results:  Lab Results  Component Value Date   WBC 6.0 05/01/2022   HGB 10.7 (L) 05/01/2022   HCT 33.0 (L) 05/01/2022   MCV 85.1 05/01/2022   PLT 323 05/01/2022   NEUTROABS 3.3 05/01/2022     Medications: I have reviewed the patient's current medications.   Assessment/Plan: Microcytic anemia due to iron deficiency 07/25/2021 urinalysis negative for hemoglobin 07/27/2021 stool cards negative for occult blood Report of thalassemia trait [place] globin gene testing - 10/25/2021 no deletions detected Change in bowel habits 3 to 4 months ago-evaluated by Dr. Marca Ancona, underwent upper and lower endoscopy    Disposition: Ms. Melamed has persistent anemia.  The ferritin is in the low end of the normal range.  I suspect the anemia secondary to iron deficiency and ongoing menstrual blood loss.  I recommended she increase the ferrous sulfate to twice daily.  She is concerned about developing constipation and wants to keep the iron at a dose of once daily.  She will call for symptoms of anemia.  She will return for an office visit, CBC, and ferritin in 3 months.  We will refer her  to gynecology for routine follow-up and to consider a diagnosis of menorrhagia.  Thornton Papas, MD  05/01/2022  10:20 AM

## 2022-08-03 ENCOUNTER — Inpatient Hospital Stay: Payer: 59 | Admitting: Nurse Practitioner

## 2022-08-03 ENCOUNTER — Inpatient Hospital Stay: Payer: 59

## 2022-08-31 ENCOUNTER — Inpatient Hospital Stay (HOSPITAL_BASED_OUTPATIENT_CLINIC_OR_DEPARTMENT_OTHER): Payer: 59 | Admitting: Nurse Practitioner

## 2022-08-31 ENCOUNTER — Inpatient Hospital Stay: Payer: 59 | Attending: Oncology

## 2022-08-31 ENCOUNTER — Telehealth: Payer: Self-pay

## 2022-08-31 ENCOUNTER — Encounter: Payer: Self-pay | Admitting: Nurse Practitioner

## 2022-08-31 VITALS — BP 108/70 | HR 77 | Temp 98.1°F | Resp 20 | Ht 63.0 in | Wt 123.4 lb

## 2022-08-31 DIAGNOSIS — D563 Thalassemia minor: Secondary | ICD-10-CM | POA: Diagnosis not present

## 2022-08-31 DIAGNOSIS — D509 Iron deficiency anemia, unspecified: Secondary | ICD-10-CM

## 2022-08-31 LAB — CBC WITH DIFFERENTIAL (CANCER CENTER ONLY)
Abs Immature Granulocytes: 0.01 10*3/uL (ref 0.00–0.07)
Basophils Absolute: 0 10*3/uL (ref 0.0–0.1)
Basophils Relative: 1 %
Eosinophils Absolute: 0.1 10*3/uL (ref 0.0–0.5)
Eosinophils Relative: 1 %
HCT: 33.2 % — ABNORMAL LOW (ref 36.0–46.0)
Hemoglobin: 10.6 g/dL — ABNORMAL LOW (ref 12.0–15.0)
Immature Granulocytes: 0 %
Lymphocytes Relative: 31 %
Lymphs Abs: 1.6 10*3/uL (ref 0.7–4.0)
MCH: 25.9 pg — ABNORMAL LOW (ref 26.0–34.0)
MCHC: 31.9 g/dL (ref 30.0–36.0)
MCV: 81 fL (ref 80.0–100.0)
Monocytes Absolute: 0.5 10*3/uL (ref 0.1–1.0)
Monocytes Relative: 10 %
Neutro Abs: 3.1 10*3/uL (ref 1.7–7.7)
Neutrophils Relative %: 57 %
Platelet Count: 294 10*3/uL (ref 150–400)
RBC: 4.1 MIL/uL (ref 3.87–5.11)
RDW: 14.5 % (ref 11.5–15.5)
WBC Count: 5.3 10*3/uL (ref 4.0–10.5)
nRBC: 0 % (ref 0.0–0.2)

## 2022-08-31 LAB — FERRITIN: Ferritin: 5 ng/mL — ABNORMAL LOW (ref 11–307)

## 2022-08-31 NOTE — Progress Notes (Signed)
  [facility] OFFICE PROGRESS NOTE   Diagnosis: Iron deficiency anemia  INTERVAL HISTORY:   Latoya Bartlett returns as scheduled.  She reports discontinuing oral iron about a month ago as a test.  She wants to see what her hemoglobin is today.  Menstrual cycle has changed with shorter duration and decreased flow.  No other bleeding.  She denies nausea.  No constipation.  She is able to complete normal activities.  She denies shortness of breath.  Objective:  Vital signs in last 24 hours:  Blood pressure 108/70, pulse 77, temperature 98.1 F (36.7 C), temperature source Oral, resp. rate 20, height 5\' 3"  (1.6 m), weight 123 lb 6.4 oz (56 kg), SpO2 100 %.    Resp: Lungs clear bilaterally. Cardio: Regular rate and rhythm. GI: Abdomen soft and nontender.  No hepatosplenomegaly. Vascular: No leg edema.   Lab Results:  Lab Results  Component Value Date   WBC 5.3 08/31/2022   HGB 10.6 (L) 08/31/2022   HCT 33.2 (L) 08/31/2022   MCV 81.0 08/31/2022   PLT 294 08/31/2022   NEUTROABS 3.1 08/31/2022    Imaging:  No results found.  Medications: I have reviewed the patient's current medications.  Assessment/Plan: Microcytic anemia due to iron deficiency 07/25/2021 urinalysis negative for hemoglobin 07/27/2021 stool cards negative for occult blood Report of thalassemia trait [place] globin gene testing - 10/25/2021 no deletions detected Change in bowel habits 3 to 4 months ago-evaluated by Dr. Therisa Doyne, underwent upper and lower endoscopy  Disposition: Latoya Bartlett has iron deficiency anemia.  She discontinued oral iron about a month ago.  Hemoglobin is stable.  We will follow-up on the ferritin.  She plans to continue to hold oral iron and work on an iron rich diet.  We provided her with a printout on iron rich foods today.  We discussed IV iron which she is not interested in at this point.  I reviewed signs/symptoms suggestive of progressive anemia with her at today's visit.   She understands to contact the office should she develop any of these.  She will return for lab and follow-up in 3 months.  We are available to see her sooner if needed.    Ned Card ANP/GNP-BC   08/31/2022  10:29 AM

## 2022-08-31 NOTE — Telephone Encounter (Signed)
Patient is agrees to take hemocyte 1 tab daily

## 2022-08-31 NOTE — Telephone Encounter (Signed)
-----   Message from Owens Shark, NP sent at 08/31/2022 11:11 AM EDT ----- Please let her know ferritin returned low at 5.  I recommend she resume oral iron.  If she does not want to take ferrous sulfate she can try Hemocyte 1 tablet daily.

## 2022-09-13 IMAGING — MG DIGITAL SCREENING BILAT W/ TOMO W/ CAD
8 series · 9 of 24 positions shown · non-contrast
Comparison: None.

CLINICAL DATA: Screening.

EXAM:
DIGITAL SCREENING BILATERAL MAMMOGRAM WITH TOMO AND CAD

[L CC synth-2D]
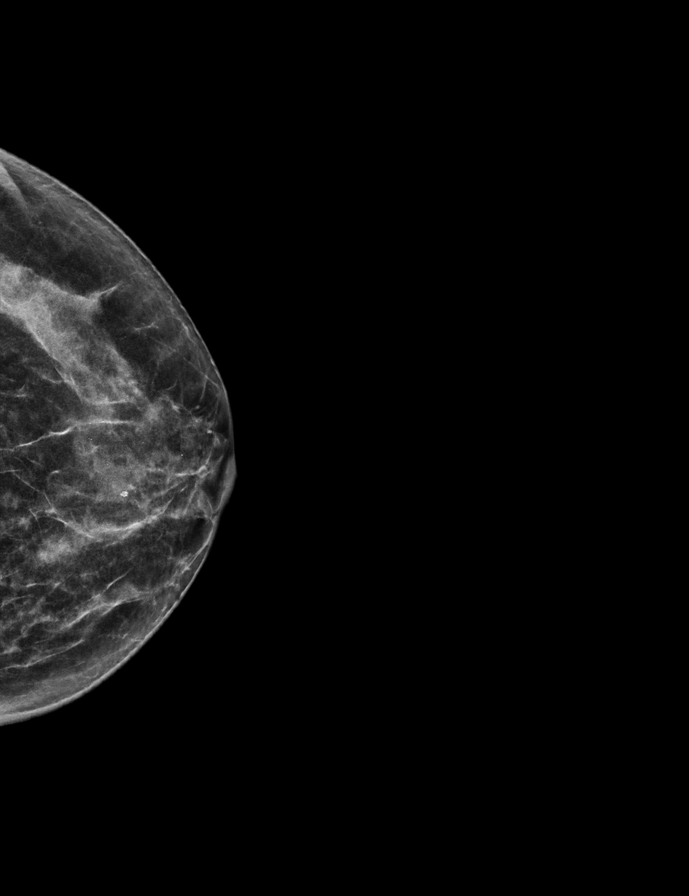

[R MLO synth-2D]
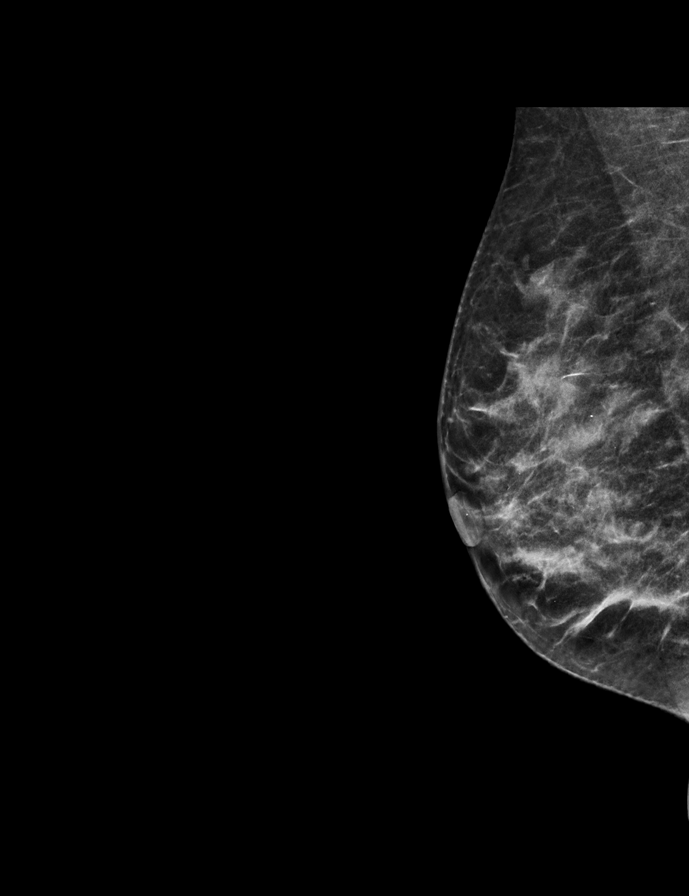

[R CC synth-2D]
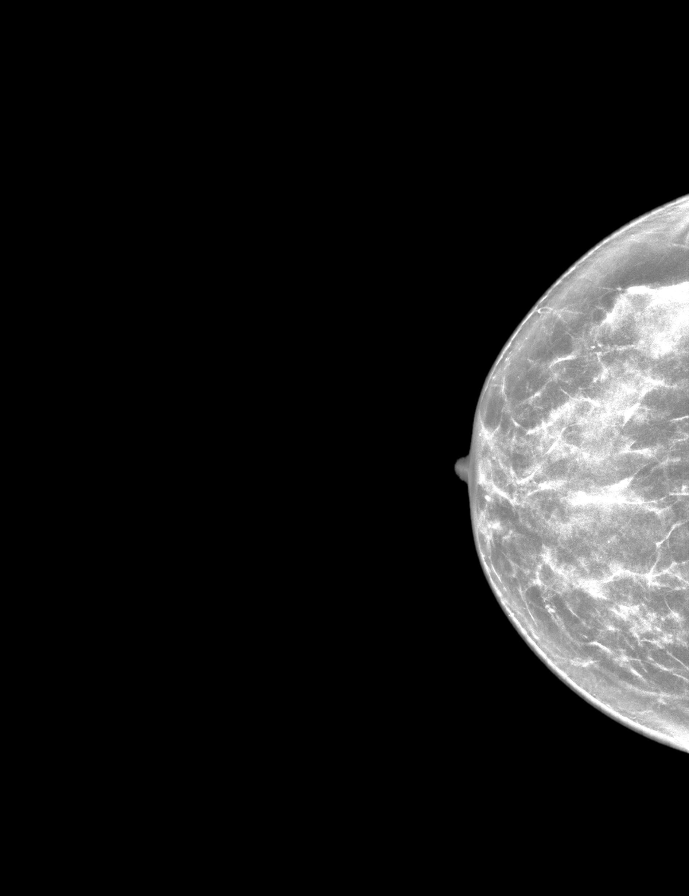

[L MLO synth-2D]
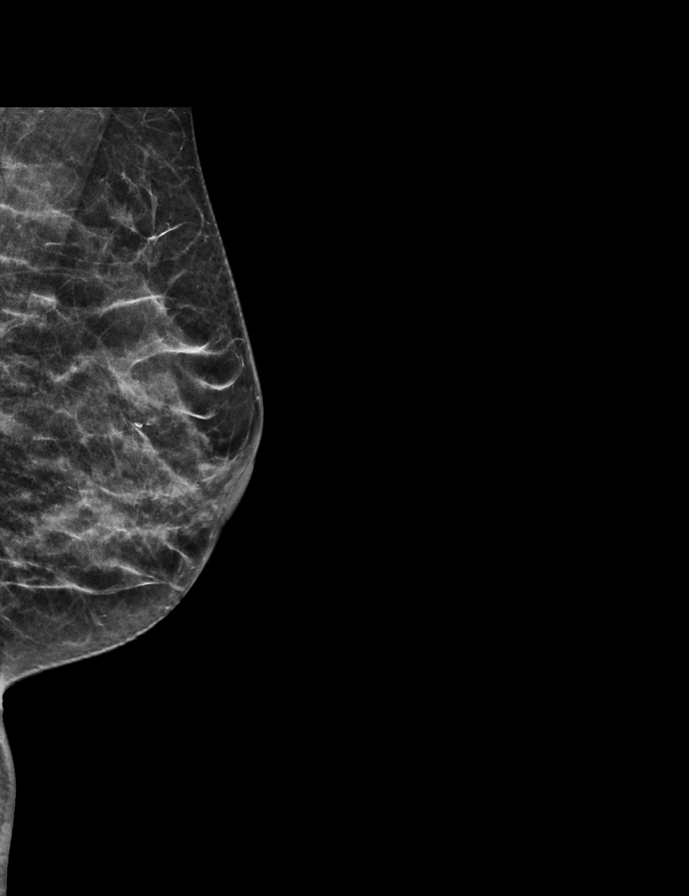

[R CC tomo · 2 of 54 frames shown]
[frame 18/54]
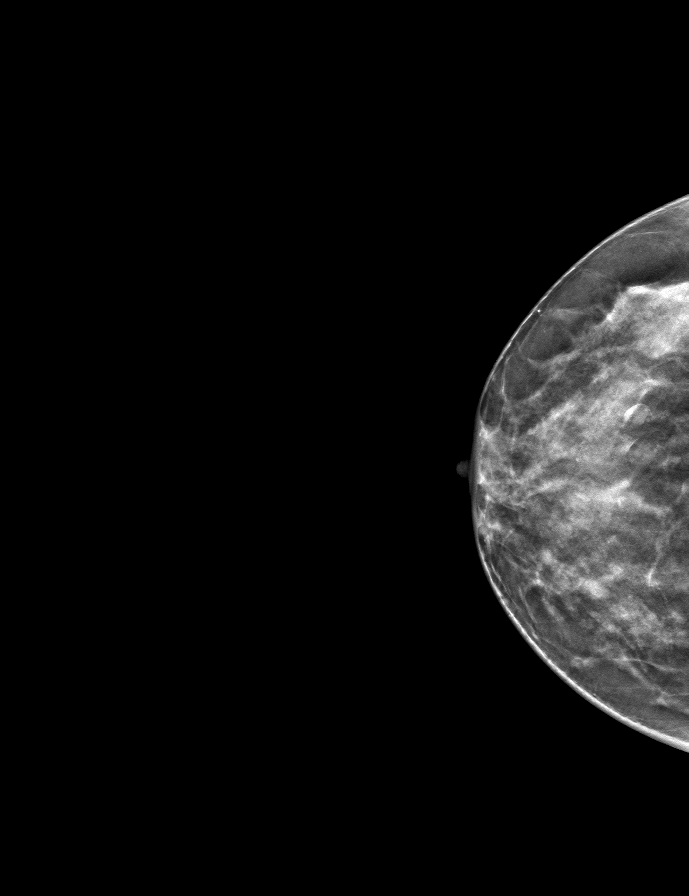
[frame 27/54]
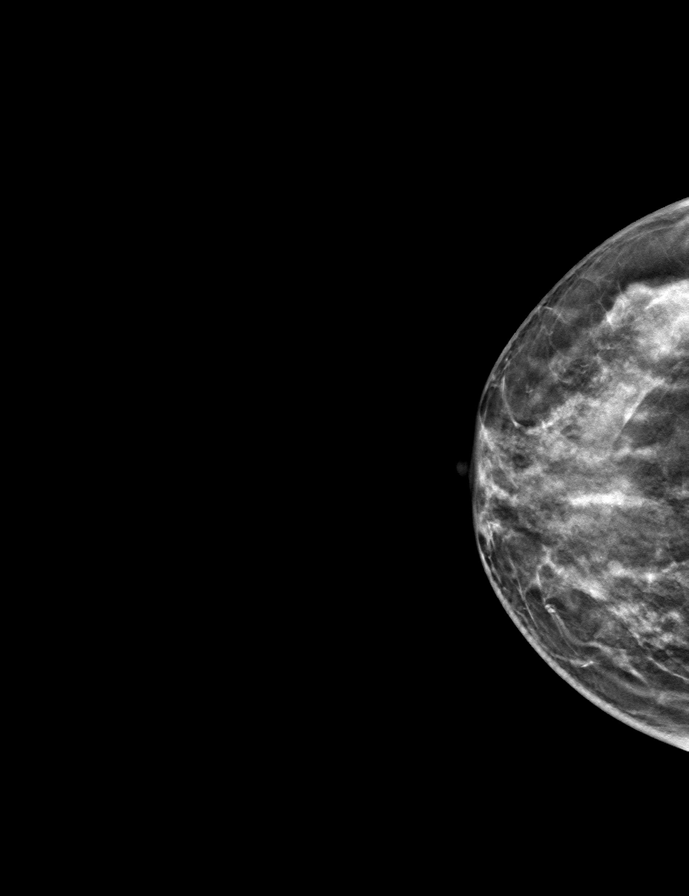

[L CC tomo · tomo slice 27/52.0]
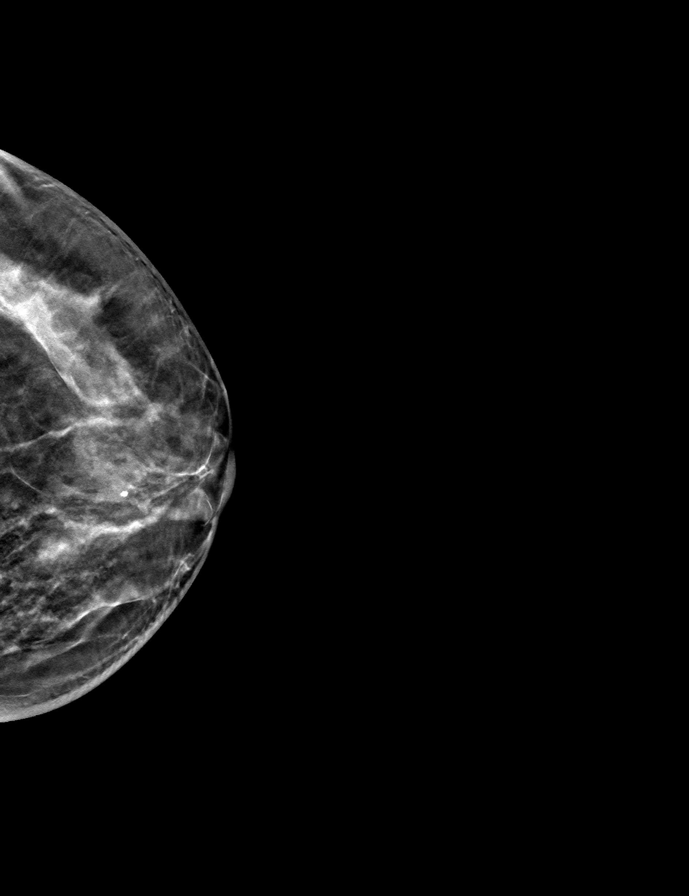

[L MLO tomo · tomo slice 25/49.0]
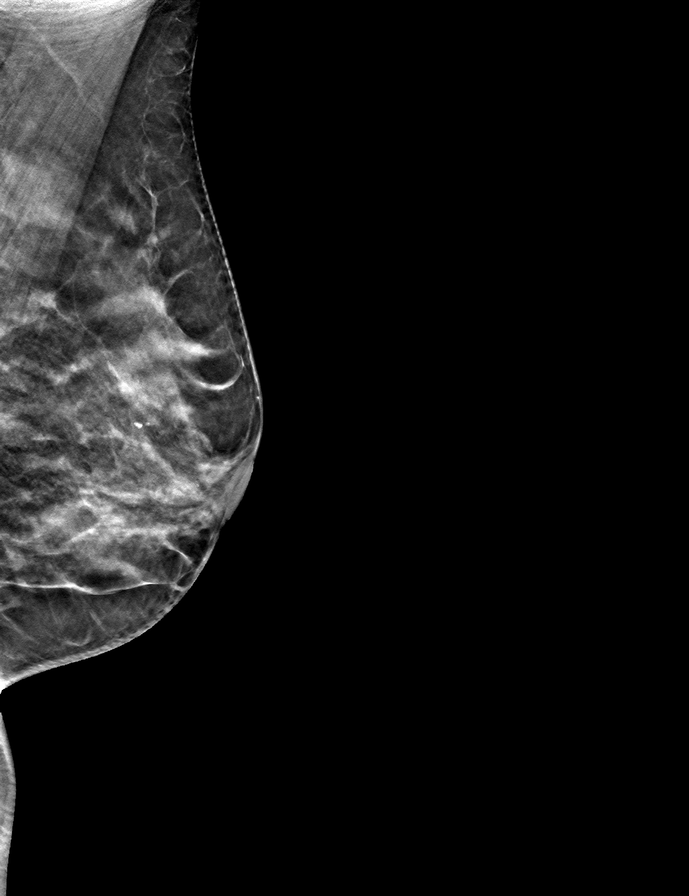

[R MLO tomo · tomo slice 27/52.0]
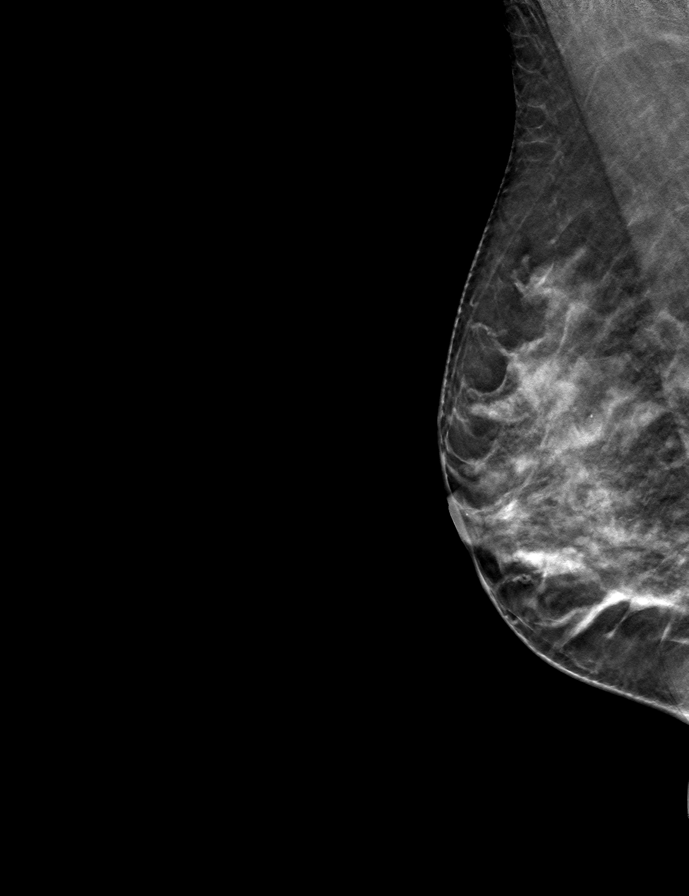

[9 of 24 positions shown; findings below may reference images not displayed]

ACR Breast Density Category c: The breast tissue is heterogeneously
dense, which may obscure small masses
FINDINGS: There are no findings suspicious for malignancy. Images were
processed with CAD.
IMPRESSION: No mammographic evidence of malignancy. A result letter of this
screening mammogram will be mailed directly to the patient.

RECOMMENDATION:
Screening mammogram in one year. (Code:EM-2-IHY)

BI-RADS CATEGORY  1: Negative.

## 2022-12-01 ENCOUNTER — Encounter: Payer: Self-pay | Admitting: Oncology

## 2022-12-01 ENCOUNTER — Inpatient Hospital Stay: Payer: 59 | Attending: Oncology

## 2022-12-01 ENCOUNTER — Inpatient Hospital Stay (HOSPITAL_BASED_OUTPATIENT_CLINIC_OR_DEPARTMENT_OTHER): Payer: 59 | Admitting: Oncology

## 2022-12-01 VITALS — BP 101/64 | HR 94 | Temp 98.1°F | Resp 18 | Ht 63.0 in | Wt 126.0 lb

## 2022-12-01 DIAGNOSIS — D509 Iron deficiency anemia, unspecified: Secondary | ICD-10-CM | POA: Diagnosis not present

## 2022-12-01 DIAGNOSIS — K5900 Constipation, unspecified: Secondary | ICD-10-CM | POA: Diagnosis not present

## 2022-12-01 LAB — CBC WITH DIFFERENTIAL (CANCER CENTER ONLY)
Abs Immature Granulocytes: 0.01 10*3/uL (ref 0.00–0.07)
Basophils Absolute: 0 10*3/uL (ref 0.0–0.1)
Basophils Relative: 1 %
Eosinophils Absolute: 0.1 10*3/uL (ref 0.0–0.5)
Eosinophils Relative: 1 %
HCT: 32.7 % — ABNORMAL LOW (ref 36.0–46.0)
Hemoglobin: 10.6 g/dL — ABNORMAL LOW (ref 12.0–15.0)
Immature Granulocytes: 0 %
Lymphocytes Relative: 17 %
Lymphs Abs: 0.9 10*3/uL (ref 0.7–4.0)
MCH: 27 pg (ref 26.0–34.0)
MCHC: 32.4 g/dL (ref 30.0–36.0)
MCV: 83.2 fL (ref 80.0–100.0)
Monocytes Absolute: 0.5 10*3/uL (ref 0.1–1.0)
Monocytes Relative: 8 %
Neutro Abs: 4 10*3/uL (ref 1.7–7.7)
Neutrophils Relative %: 73 %
Platelet Count: 282 10*3/uL (ref 150–400)
RBC: 3.93 MIL/uL (ref 3.87–5.11)
RDW: 13.4 % (ref 11.5–15.5)
WBC Count: 5.5 10*3/uL (ref 4.0–10.5)
nRBC: 0 % (ref 0.0–0.2)

## 2022-12-01 LAB — FERRITIN: Ferritin: 8 ng/mL — ABNORMAL LOW (ref 11–307)

## 2022-12-01 NOTE — Progress Notes (Signed)
  [facility] OFFICE PROGRESS NOTE   Diagnosis: Iron deficiency anemia  INTERVAL HISTORY:   [name] returns as scheduled.  She is not taking iron.  She reports iron causes increased "gas ", abdominal tightness, and constipation.  She has a monthly menstrual cycle.  No other bleeding.  She feels well.  She wonders whether weight gain is related to eating fig bars.  Objective:  Vital signs in last 24 hours:  Blood pressure 101/64, pulse 94, temperature 98.1 F (36.7 C), temperature source Oral, resp. rate 18, height 5\' 3"  (1.6 m), weight 126 lb (57.2 kg), SpO2 100 %.    HEENT: Tongue without erythema, pharynx without erythema or exudate Resp: Lungs clear bilaterally Cardio: Regular rate and rhythm GI: No mass, nontender, no hepatosplenomegaly, no apparent ascites Vascular: No leg edema   Lab Results:  Lab Results  Component Value Date   WBC 5.5 12/01/2022   HGB 10.6 (L) 12/01/2022   HCT 32.7 (L) 12/01/2022   MCV 83.2 12/01/2022   PLT 282 12/01/2022   NEUTROABS 4.0 12/01/2022     Medications: I have reviewed the patient's current medications.   Assessment/Plan: Microcytic anemia due to iron deficiency 07/25/2021 urinalysis negative for hemoglobin 07/27/2021 stool cards negative for occult blood Report of thalassemia trait Alpha globin gene testing - 10/25/2021 no deletions detected Change in bowel habits 3 to 4 months ago-evaluated by Dr. Marca Ancona, underwent upper and lower endoscopy    Disposition: Latoya Bartlett has persistent mild anemia.  The ferritin level was low when she was here in March.  We will follow-up on the ferritin level today.  I suspect she has iron deficiency anemia secondary to menstrual blood loss.  I recommended she resume ferrous sulfate twice daily.  She will try Senokot and MiraLAX for constipation.  She will return for an office and lab visit in 3 months.  She will call for symptoms of anemia.  Thornton Papas, MD  12/01/2022   11:16 AM

## 2022-12-05 ENCOUNTER — Telehealth: Payer: Self-pay

## 2022-12-05 NOTE — Telephone Encounter (Signed)
-----   Message from Ladene Artist, MD sent at 12/04/2022  5:04 PM EDT ----- Please call patient the iron level is low, take iron twice daily, follow-up as scheduled

## 2022-12-05 NOTE — Telephone Encounter (Signed)
Patient gave verbal understanding and had no further questions or concerns  

## 2023-03-05 ENCOUNTER — Inpatient Hospital Stay: Payer: BC Managed Care – PPO | Admitting: Nurse Practitioner

## 2023-03-05 ENCOUNTER — Inpatient Hospital Stay: Payer: BC Managed Care – PPO | Attending: Oncology

## 2023-04-05 ENCOUNTER — Inpatient Hospital Stay: Payer: BC Managed Care – PPO | Attending: Oncology

## 2023-04-05 ENCOUNTER — Inpatient Hospital Stay: Payer: BC Managed Care – PPO | Admitting: Nurse Practitioner

## 2023-04-05 ENCOUNTER — Encounter: Payer: Self-pay | Admitting: Nurse Practitioner

## 2023-04-05 VITALS — BP 107/67 | HR 68 | Temp 97.9°F | Resp 18 | Ht 63.0 in | Wt 124.4 lb

## 2023-04-05 DIAGNOSIS — D509 Iron deficiency anemia, unspecified: Secondary | ICD-10-CM | POA: Diagnosis present

## 2023-04-05 LAB — CBC WITH DIFFERENTIAL (CANCER CENTER ONLY)
Abs Immature Granulocytes: 0.02 10*3/uL (ref 0.00–0.07)
Basophils Absolute: 0 10*3/uL (ref 0.0–0.1)
Basophils Relative: 1 %
Eosinophils Absolute: 0.1 10*3/uL (ref 0.0–0.5)
Eosinophils Relative: 1 %
HCT: 34.3 % — ABNORMAL LOW (ref 36.0–46.0)
Hemoglobin: 11.2 g/dL — ABNORMAL LOW (ref 12.0–15.0)
Immature Granulocytes: 0 %
Lymphocytes Relative: 29 %
Lymphs Abs: 1.6 10*3/uL (ref 0.7–4.0)
MCH: 27.9 pg (ref 26.0–34.0)
MCHC: 32.7 g/dL (ref 30.0–36.0)
MCV: 85.3 fL (ref 80.0–100.0)
Monocytes Absolute: 0.4 10*3/uL (ref 0.1–1.0)
Monocytes Relative: 7 %
Neutro Abs: 3.5 10*3/uL (ref 1.7–7.7)
Neutrophils Relative %: 62 %
Platelet Count: 313 10*3/uL (ref 150–400)
RBC: 4.02 MIL/uL (ref 3.87–5.11)
RDW: 12.6 % (ref 11.5–15.5)
WBC Count: 5.6 10*3/uL (ref 4.0–10.5)
nRBC: 0 % (ref 0.0–0.2)

## 2023-04-05 LAB — FERRITIN: Ferritin: 22 ng/mL (ref 11–307)

## 2023-04-05 NOTE — Progress Notes (Signed)
  [facility] OFFICE PROGRESS NOTE   Diagnosis: Iron deficiency anemia  INTERVAL HISTORY:   Ms. Latoya Bartlett returns for follow-up.  She is taking ferrous sulfate 1 tablet daily, tolerating it well.  If she tries to increase to 2 tablets daily she notes GI symptoms.  Only bleeding is related to her menstrual cycle which typically occurs every 28 days.  She feels the bleeding is heavy for about 2 of the 5 days.  She feels "tired and sleepy" mainly in the morning hours.  She does not crave ice.  No tongue soreness.  Overall eats a regular diet.  Objective:  Vital signs in last 24 hours:  Blood pressure 107/67, pulse 68, temperature 97.9 F (36.6 C), temperature source Oral, resp. rate 18, height 5\' 3"  (1.6 m), weight 124 lb 6.4 oz (56.4 kg), SpO2 95%.    HEENT: No thrush or ulcers. Resp: Lungs clear bilaterally. Cardio: Regular rate and rhythm. GI: No hepatosplenomegaly. Vascular: No leg edema.   Lab Results:  Lab Results  Component Value Date   WBC 5.6 04/05/2023   HGB 11.2 (L) 04/05/2023   HCT 34.3 (L) 04/05/2023   MCV 85.3 04/05/2023   PLT 313 04/05/2023   NEUTROABS 3.5 04/05/2023    Imaging:  No results found.  Medications: I have reviewed the patient's current medications.  Assessment/Plan: Microcytic anemia due to iron deficiency 07/25/2021 urinalysis negative for hemoglobin 07/27/2021 stool cards negative for occult blood Report of thalassemia trait Hemoglobin fractionation cascade 07/25/2021-normal  alpha globin gene testing - 10/25/2021 no deletions detected Change in bowel habits 3 to 4 months ago-evaluated by Dr. Marca Ancona, underwent upper and lower endoscopy  Disposition: Latoya Bartlett.  She continues oral iron once daily.  She is unable to increase further due to GI symptoms.  The iron deficiency anemia is likely secondary to menstrual blood loss.  Hemoglobin today is higher.  We will follow-up on the ferritin.  She will return for  follow-up and lab in 3 to 4 months.  We are available to see her sooner if needed.    Lonna Cobb ANP/GNP-BC   04/05/2023  11:48 AM

## 2023-07-05 ENCOUNTER — Other Ambulatory Visit: Payer: BC Managed Care – PPO

## 2023-07-05 ENCOUNTER — Inpatient Hospital Stay: Payer: BC Managed Care – PPO | Attending: Oncology | Admitting: Oncology

## 2023-07-05 ENCOUNTER — Inpatient Hospital Stay: Payer: BC Managed Care – PPO

## 2023-07-05 VITALS — BP 99/66 | HR 85 | Temp 98.2°F | Resp 18 | Ht 63.0 in | Wt 126.4 lb

## 2023-07-05 DIAGNOSIS — D509 Iron deficiency anemia, unspecified: Secondary | ICD-10-CM | POA: Diagnosis present

## 2023-07-05 DIAGNOSIS — R194 Change in bowel habit: Secondary | ICD-10-CM | POA: Diagnosis not present

## 2023-07-05 DIAGNOSIS — D563 Thalassemia minor: Secondary | ICD-10-CM | POA: Insufficient documentation

## 2023-07-05 LAB — CBC WITH DIFFERENTIAL (CANCER CENTER ONLY)
Abs Immature Granulocytes: 0.01 10*3/uL (ref 0.00–0.07)
Basophils Absolute: 0 10*3/uL (ref 0.0–0.1)
Basophils Relative: 1 %
Eosinophils Absolute: 0.1 10*3/uL (ref 0.0–0.5)
Eosinophils Relative: 2 %
HCT: 33.9 % — ABNORMAL LOW (ref 36.0–46.0)
Hemoglobin: 10.9 g/dL — ABNORMAL LOW (ref 12.0–15.0)
Immature Granulocytes: 0 %
Lymphocytes Relative: 26 %
Lymphs Abs: 1.2 10*3/uL (ref 0.7–4.0)
MCH: 27.1 pg (ref 26.0–34.0)
MCHC: 32.2 g/dL (ref 30.0–36.0)
MCV: 84.3 fL (ref 80.0–100.0)
Monocytes Absolute: 0.4 10*3/uL (ref 0.1–1.0)
Monocytes Relative: 9 %
Neutro Abs: 2.9 10*3/uL (ref 1.7–7.7)
Neutrophils Relative %: 62 %
Platelet Count: 325 10*3/uL (ref 150–400)
RBC: 4.02 MIL/uL (ref 3.87–5.11)
RDW: 12 % (ref 11.5–15.5)
WBC Count: 4.7 10*3/uL (ref 4.0–10.5)
nRBC: 0 % (ref 0.0–0.2)

## 2023-07-05 LAB — FERRITIN: Ferritin: 15 ng/mL (ref 11–307)

## 2023-07-05 NOTE — Progress Notes (Signed)
  [facility] OFFICE PROGRESS NOTE   Diagnosis: Iron deficiency anemia  INTERVAL HISTORY:   Latoya Bartlett returns as scheduled.  She generally feels well.  She has a monthly menstrual cycle.  There is heavy bleeding for a few days.  No other bleeding.  She has not taken iron consistently for the past month due to visitors for the holidays and a trip to [place].  She returned from [place] 06/29/2023.  She reports iron causes constipation and gas discomfort.  She is able to tolerate iron once daily.  She feels that she is perimenopausal as she is having hot flashes.  Objective:  Vital signs in last 24 hours:  Blood pressure 99/66, pulse 85, temperature 98.2 F (36.8 C), resp. rate 18, height 5\' 3"  (1.6 m), weight 126 lb 6.4 oz (57.3 kg), SpO2 100%.    Resp: Lungs clear bilaterally Cardio: Regular rate and rhythm GI: Nontender, no hepatosplenomegaly Vascular: No leg edema   Lab Results:  Lab Results  Component Value Date   WBC 4.7 07/05/2023   HGB 10.9 (L) 07/05/2023   HCT 33.9 (L) 07/05/2023   MCV 84.3 07/05/2023   PLT 325 07/05/2023   NEUTROABS 2.9 07/05/2023     Medications: I have reviewed the patient's current medications.   Assessment/Plan: Microcytic anemia due to iron deficiency 07/25/2021 urinalysis negative for hemoglobin 07/27/2021 stool cards negative for occult blood Report of thalassemia trait Hemoglobin fractionation cascade 07/25/2021-normal  alpha globin gene testing - 10/25/2021 no deletions detected Change in bowel habits 3 to 4 months ago-evaluated by Dr. Marca Ancona, underwent upper and lower endoscopy    Disposition: Latoya Bartlett has a history of iron deficiency anemia.  The hemoglobin is mildly low.  We will follow-up on ferritin level from today.  She will resume once daily iron therapy.  She will return for lab visit in 4 months and an office visit in 8 months.  We we will consider IV iron if the hemoglobin falls and she is unable to  tolerate an increased dose of oral iron.  Iron deficiency is likely related to menstrual blood loss.  Thornton Papas, MD  07/05/2023  10:50 AM

## 2023-10-21 IMAGING — DX DG ABDOMEN 2V
2 series · 2 of 2 positions shown · non-contrast
Comparison: None.

CLINICAL DATA: Abdominal distension

EXAM:
ABDOMEN - 2 VIEW

[abdomen supine]
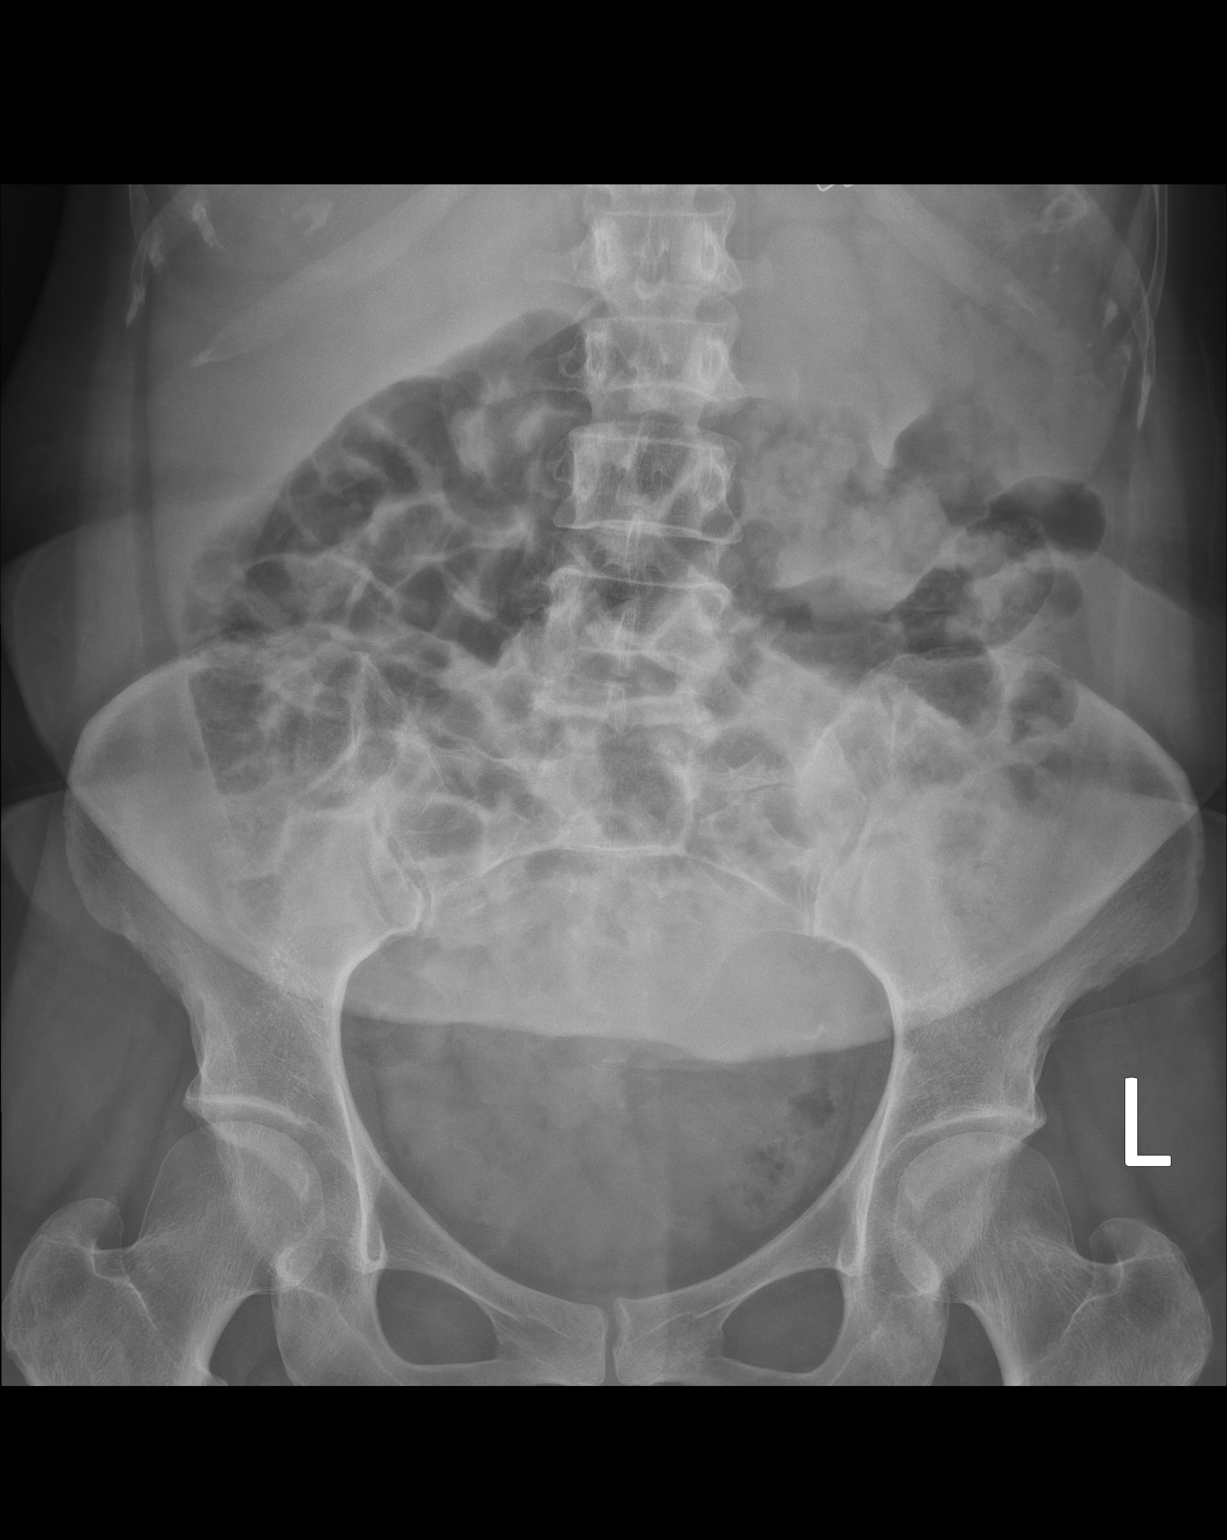

[abdomen erect]
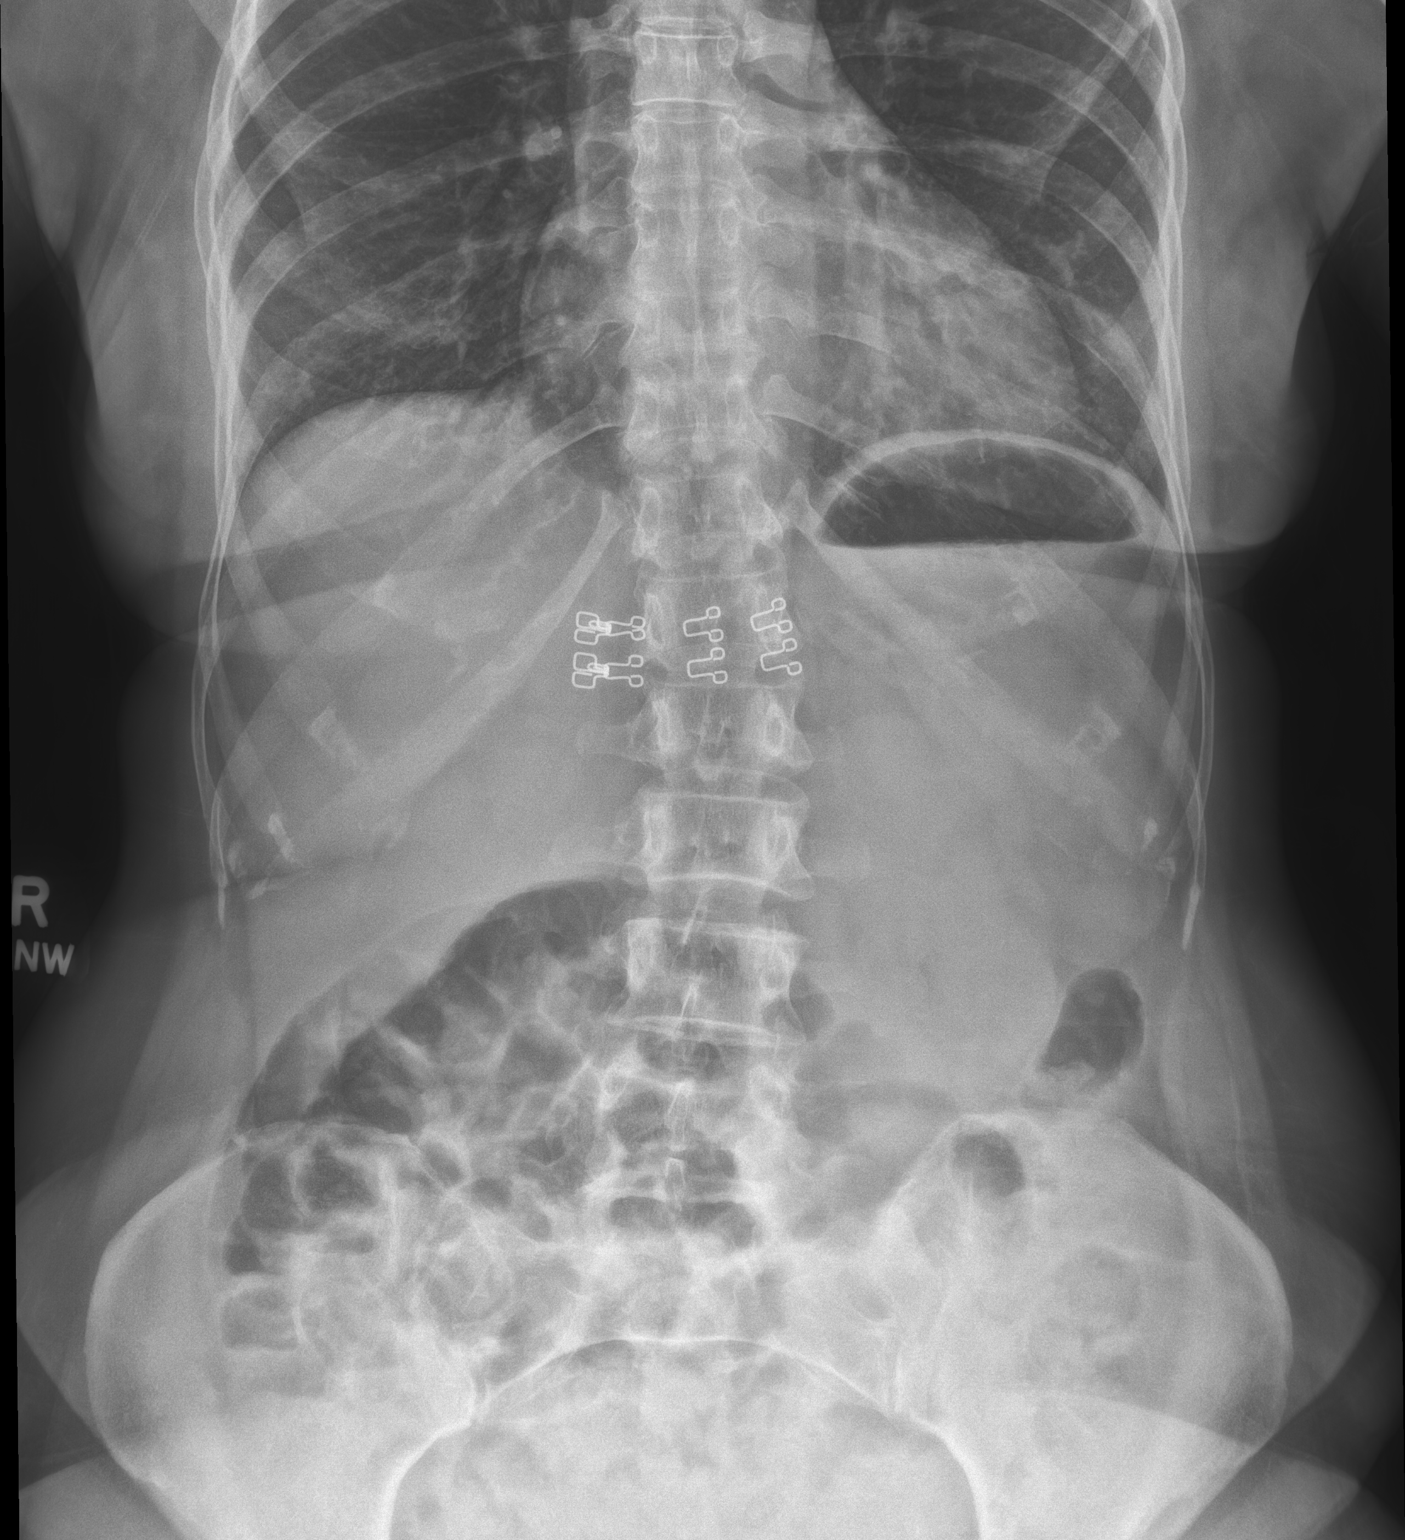

[2 of 2 positions shown; findings below may reference images not displayed]

FINDINGS: Gas-filled loops of nondilated small bowel. There is stool in
transverse colon. Small amounts stool in the rectosigmoid colon. No
dilated large or small bowel. Paucity of gas within pelvis.
IMPRESSION: None radiographic evidence of bowel obstruction.

Consider CT abdomen pelvis if  concern for pelvic mass.

## 2023-11-01 ENCOUNTER — Ambulatory Visit: Payer: Self-pay | Admitting: Oncology

## 2023-11-01 ENCOUNTER — Inpatient Hospital Stay: Payer: BC Managed Care – PPO | Attending: Oncology

## 2023-11-01 DIAGNOSIS — D509 Iron deficiency anemia, unspecified: Secondary | ICD-10-CM | POA: Diagnosis present

## 2023-11-01 LAB — CBC WITH DIFFERENTIAL (CANCER CENTER ONLY)
Abs Immature Granulocytes: 0.01 10*3/uL (ref 0.00–0.07)
Basophils Absolute: 0 10*3/uL (ref 0.0–0.1)
Basophils Relative: 1 %
Eosinophils Absolute: 0.1 10*3/uL (ref 0.0–0.5)
Eosinophils Relative: 2 %
HCT: 31.6 % — ABNORMAL LOW (ref 36.0–46.0)
Hemoglobin: 10 g/dL — ABNORMAL LOW (ref 12.0–15.0)
Immature Granulocytes: 0 %
Lymphocytes Relative: 32 %
Lymphs Abs: 1.5 10*3/uL (ref 0.7–4.0)
MCH: 26.5 pg (ref 26.0–34.0)
MCHC: 31.6 g/dL (ref 30.0–36.0)
MCV: 83.6 fL (ref 80.0–100.0)
Monocytes Absolute: 0.3 10*3/uL (ref 0.1–1.0)
Monocytes Relative: 7 %
Neutro Abs: 2.6 10*3/uL (ref 1.7–7.7)
Neutrophils Relative %: 58 %
Platelet Count: 355 10*3/uL (ref 150–400)
RBC: 3.78 MIL/uL — ABNORMAL LOW (ref 3.87–5.11)
RDW: 12.3 % (ref 11.5–15.5)
WBC Count: 4.5 10*3/uL (ref 4.0–10.5)
nRBC: 0 % (ref 0.0–0.2)

## 2023-11-01 LAB — FERRITIN: Ferritin: 17 ng/mL (ref 11–307)

## 2023-11-06 ENCOUNTER — Other Ambulatory Visit: Payer: Self-pay

## 2023-11-06 ENCOUNTER — Telehealth: Payer: Self-pay

## 2023-11-06 DIAGNOSIS — D509 Iron deficiency anemia, unspecified: Secondary | ICD-10-CM

## 2023-11-06 NOTE — Telephone Encounter (Addendum)
 Verbally understood, no further questions. Note placed in results/ follow up.

## 2023-11-06 NOTE — Telephone Encounter (Signed)
-----   Message from Coni Deep sent at 11/01/2023  8:13 PM EDT ----- Please call patient, the ferritin is at the low end of normal range, continue iron, repeat ferritin and cbc 2 months

## 2023-11-06 NOTE — Telephone Encounter (Signed)
 Understood, no further questions. Patient supposed to return for CBC/ Ferritin around the week of 01/01/24-01/09/24, orders placed, sent a scheduling message to set up lab appointment.

## 2024-01-02 ENCOUNTER — Ambulatory Visit: Payer: Self-pay | Admitting: Oncology

## 2024-01-02 ENCOUNTER — Inpatient Hospital Stay: Attending: Oncology

## 2024-01-02 DIAGNOSIS — D509 Iron deficiency anemia, unspecified: Secondary | ICD-10-CM | POA: Diagnosis present

## 2024-01-02 LAB — CBC WITH DIFFERENTIAL (CANCER CENTER ONLY)
Abs Immature Granulocytes: 0.01 K/uL (ref 0.00–0.07)
Basophils Absolute: 0 K/uL (ref 0.0–0.1)
Basophils Relative: 1 %
Eosinophils Absolute: 0.1 K/uL (ref 0.0–0.5)
Eosinophils Relative: 1 %
HCT: 34.6 % — ABNORMAL LOW (ref 36.0–46.0)
Hemoglobin: 11.1 g/dL — ABNORMAL LOW (ref 12.0–15.0)
Immature Granulocytes: 0 %
Lymphocytes Relative: 32 %
Lymphs Abs: 1.6 K/uL (ref 0.7–4.0)
MCH: 26.7 pg (ref 26.0–34.0)
MCHC: 32.1 g/dL (ref 30.0–36.0)
MCV: 83.4 fL (ref 80.0–100.0)
Monocytes Absolute: 0.4 K/uL (ref 0.1–1.0)
Monocytes Relative: 8 %
Neutro Abs: 2.9 K/uL (ref 1.7–7.7)
Neutrophils Relative %: 58 %
Platelet Count: 302 K/uL (ref 150–400)
RBC: 4.15 MIL/uL (ref 3.87–5.11)
RDW: 13.3 % (ref 11.5–15.5)
WBC Count: 4.9 K/uL (ref 4.0–10.5)
nRBC: 0 % (ref 0.0–0.2)

## 2024-01-02 LAB — FERRITIN: Ferritin: 23 ng/mL (ref 11–307)

## 2024-03-03 ENCOUNTER — Ambulatory Visit
Admission: RE | Admit: 2024-03-03 | Discharge: 2024-03-03 | Disposition: A | Discharge status: Home / Self Care | Source: Ambulatory Visit

## 2024-03-03 VITALS — BP 107/72 | HR 66 | Temp 98.1°F

## 2024-03-03 DIAGNOSIS — K644 Residual hemorrhoidal skin tags: Secondary | ICD-10-CM | POA: Diagnosis not present

## 2024-03-03 MED ORDER — HYDROCORT-PRAMOXINE (PERIANAL) 1-1 % EX FOAM: 1.0000 | Freq: Two times a day (BID) | CUTANEOUS | 3 refills | Status: AC

## 2024-03-03 MED ORDER — HEMORRHOIDAL 0.25-14-74.9 % RE OINT: 1.0000 | TOPICAL_OINTMENT | Freq: Two times a day (BID) | RECTAL | 0 refills | Status: AC | PRN

## 2024-03-03 NOTE — Discharge Instructions (Addendum)
  1. External hemorrhoid (Primary) - hydrocortisone -pramoxine (PROCTOFOAM-HC) rectal foam; Place 1 applicator rectally 2 (two) times daily.  Dispense: 10 g; Refill: 3 - phenylephrine-shark liver oil-mineral oil-petrolatum (HEMORRHOIDAL) 0.25-14-74.9 % rectal ointment; Place 1 Application rectally 2 (two) times daily as needed for hemorrhoids.  Dispense: 28 g; Refill: 0 -Try sitting in a warm bath for 15 to 20 minutes prior to applying ointment or Proctofoam to help relax hemorrhoids prior to application. - Take ibuprofen 400 to 600 mg prior to application of ointment to help with pain and inflammation - Ambulatory referral to Gastroenterology for follow-up evaluation and management of external hemorrhoids if topical treatment does not improve symptoms.

## 2024-03-03 NOTE — ED Provider Notes (Signed)
 [facility]  Note:  This document was prepared using Dragon voice recognition software and may include unintentional dictation errors.  MRN: [id number] DOB: June 15, 1975  Subjective:   Latoya Bartlett is a 48 y.o. female presenting for rectal pain with external swelling x 1 day.  Patient reports that she has had internal hemorrhoids in the past but states that symptoms are much more severe.  Patient has not been taking over-the-counter medication but is unsure if there is any more or something else.  Patient denies any rectal bleeding but has extensive pain with bowel movements with washing.  Patient denies any recent constipation or severe diarrhea.  Patient here for evaluation to see if there is a hemorrhoid for treatment.  No current facility-administered medications for this encounter.  Current Outpatient Medications:    hydrocortisone -pramoxine (PROCTOFOAM-HC) rectal foam, Place 1 applicator rectally 2 (two) times daily., Disp: 10 g, Rfl: 3   phenylephrine-shark liver oil-mineral oil-petrolatum (HEMORRHOIDAL) 0.25-14-74.9 % rectal ointment, Place 1 Application rectally 2 (two) times daily as needed for hemorrhoids., Disp: 28 g, Rfl: 0   Cyanocobalamin (VITAMIN B 12) 100 MCG LOZG, Take 100 mcg by mouth daily., Disp: , Rfl:    ferrous sulfate 325 (65 FE) MG tablet, Take 325 mg by mouth every morning., Disp: , Rfl:    folic acid-vitamin b complex-vitamin c-selenium-zinc (DIALYVITE) 3 MG TABS tablet, Take 1 tablet by mouth daily., Disp: , Rfl:    Multiple Vitamin (MULTIVITAMIN) tablet, Take 1 tablet by mouth daily., Disp: , Rfl:    No Known Allergies  History reviewed. No pertinent past medical history.   History reviewed. No pertinent surgical history.  Family History  Problem Relation Age of Onset   Breast cancer Neg Hx     Social History   Tobacco Use   Smoking status: Never   Smokeless tobacco: Never  Substance Use Topics   Alcohol use: Not Currently    Drug use: Not Currently    ROS Refer to HPI for ROS details.  Objective:   Vitals: BP 107/72 (BP Location: Right Arm)   Pulse 66   Temp 98.1 F (36.7 C) (Oral)   LMP 02/04/2024 (Approximate)   SpO2 99%   Physical Exam Vitals and nursing note reviewed.  Constitutional:      General: She is not in acute distress.    Appearance: Normal appearance. She is well-developed. She is not ill-appearing or toxic-appearing.  HENT:     Head: Normocephalic and atraumatic.  Cardiovascular:     Rate and Rhythm: Normal rate.  Pulmonary:     Effort: Pulmonary effort is normal. No respiratory distress.  Genitourinary:    Rectum: Tenderness and external hemorrhoid present.  Skin:    General: Skin is warm and dry.  Neurological:     General: No focal deficit present.     Mental Status: She is alert and oriented to person, place, and time.  Psychiatric:        Mood and Affect: Mood normal.        Behavior: Behavior normal.     Procedures  No results found for this or any previous visit (from the past 24 hours).  No results found.   Assessment and Plan :     Discharge Instructions       1. External hemorrhoid (Primary) - hydrocortisone -pramoxine (PROCTOFOAM-HC) rectal foam; Place 1 applicator rectally 2 (two) times daily.  Dispense: 10 g; Refill: 3 - phenylephrine-shark liver oil-mineral oil-petrolatum (HEMORRHOIDAL) 0.25-14-74.9 % rectal ointment; Place 1  Application rectally 2 (two) times daily as needed for hemorrhoids.  Dispense: 28 g; Refill: 0 -Try sitting in a warm bath for 15 to 20 minutes prior to applying ointment or Proctofoam to help relax hemorrhoids prior to application. - Take ibuprofen 400 to 600 mg prior to application of ointment to help with pain and inflammation - Ambulatory referral to Gastroenterology for follow-up evaluation and management of external hemorrhoids if topical treatment does not improve symptoms.      Mavin Dyke B Nehal Witting   Alexina Niccoli,  [place], [place] 03/03/24 1130

## 2024-03-03 NOTE — ED Triage Notes (Signed)
 Pt states yesterday she had bowel movement and afterwards she began having pain and a protrusion from rectum that is hard and wont go back in. Pt denies having any blood.   States she had hemorrhoids a long time ago but this is different.

## 2024-03-04 ENCOUNTER — Inpatient Hospital Stay: Payer: BC Managed Care – PPO | Attending: Oncology | Admitting: Oncology

## 2024-03-04 ENCOUNTER — Other Ambulatory Visit: Payer: Self-pay | Admitting: *Deleted

## 2024-03-04 ENCOUNTER — Inpatient Hospital Stay: Payer: BC Managed Care – PPO

## 2024-03-04 ENCOUNTER — Encounter: Payer: Self-pay | Admitting: *Deleted

## 2024-03-04 VITALS — BP 102/71 | HR 82 | Temp 98.2°F | Resp 16 | Wt 123.5 lb

## 2024-03-04 DIAGNOSIS — D509 Iron deficiency anemia, unspecified: Secondary | ICD-10-CM

## 2024-03-04 DIAGNOSIS — D563 Thalassemia minor: Secondary | ICD-10-CM | POA: Diagnosis not present

## 2024-03-04 DIAGNOSIS — K644 Residual hemorrhoidal skin tags: Secondary | ICD-10-CM | POA: Diagnosis not present

## 2024-03-04 LAB — FERRITIN: Ferritin: 23 ng/mL (ref 11–307)

## 2024-03-04 LAB — CBC WITH DIFFERENTIAL (CANCER CENTER ONLY)
Abs Immature Granulocytes: 0.01 K/uL (ref 0.00–0.07)
Basophils Absolute: 0 K/uL (ref 0.0–0.1)
Basophils Relative: 0 %
Eosinophils Absolute: 0.1 K/uL (ref 0.0–0.5)
Eosinophils Relative: 2 %
HCT: 34.8 % — ABNORMAL LOW (ref 36.0–46.0)
Hemoglobin: 11 g/dL — ABNORMAL LOW (ref 12.0–15.0)
Immature Granulocytes: 0 %
Lymphocytes Relative: 29 %
Lymphs Abs: 1.4 K/uL (ref 0.7–4.0)
MCH: 27 pg (ref 26.0–34.0)
MCHC: 31.6 g/dL (ref 30.0–36.0)
MCV: 85.3 fL (ref 80.0–100.0)
Monocytes Absolute: 0.4 K/uL (ref 0.1–1.0)
Monocytes Relative: 9 %
Neutro Abs: 2.9 K/uL (ref 1.7–7.7)
Neutrophils Relative %: 60 %
Platelet Count: 295 K/uL (ref 150–400)
RBC: 4.08 MIL/uL (ref 3.87–5.11)
RDW: 13.4 % (ref 11.5–15.5)
WBC Count: 4.9 K/uL (ref 4.0–10.5)
nRBC: 0 % (ref 0.0–0.2)

## 2024-03-04 NOTE — Progress Notes (Signed)
  [facility] OFFICE PROGRESS NOTE   Diagnosis: Iron deficiency anemia  INTERVAL HISTORY:   Latoya Bartlett returns as scheduled.  She continues once daily iron.  She has a monthly menstrual cycle lasting 5-6 days.  No other bleeding.  She developed a painful hemorrhoid approximately 2 days ago.  She has a history of a bleeding hemorrhoid in 2004.  She was seen at [facility] urgent care yesterday and prescribed Proctofoam.  The hemorrhoid remains painful.  Objective:  Vital signs in last 24 hours:  Blood pressure 102/71, pulse 82, temperature 98.2 F (36.8 C), temperature source Temporal, resp. rate 16, weight 123 lb 8 oz (56 kg), last menstrual period 02/04/2024, SpO2 100%.   Resp: Lungs clear bilaterally Cardio: Regular rate and rhythm GI: No hepatosplenomegaly Vascular: No leg edema Rectal: External exam reveals a hemorrhoid at the superior anal verge with associated tenderness, the hemorrhoid is smooth and soft, no bleeding   Lab Results:  Lab Results  Component Value Date   WBC 4.9 03/04/2024   HGB 11.0 (L) 03/04/2024   HCT 34.8 (L) 03/04/2024   MCV 85.3 03/04/2024   PLT 295 03/04/2024   NEUTROABS 2.9 03/04/2024     Medications: I have reviewed the patient's current medications.   Assessment/Plan: Microcytic anemia due to iron deficiency 07/25/2021 urinalysis negative for hemoglobin 07/27/2021 stool cards negative for occult blood Report of thalassemia trait Hemoglobin fractionation cascade 07/25/2021-normal  alpha globin gene testing - 10/25/2021 no deletions detected Change in bowel habits 3 to 4 months ago-evaluated by Dr. Saintclair, underwent upper and lower endoscopy External hemorrhoid 03/04/2024: Referred to GI    Disposition: Latoya Bartlett has a history of iron deficiency anemia.  The hemoglobin is mildly decreased and the ferritin remains in the low normal range.  I recommend she increase the ferrous sulfate to twice daily.  She will return for a CBC  in 4 months and an office visit in 8 months.  She has a painful external hemorrhoid.  She will continue the Proctofoam and sitz bath's as recommended by urgent care.  We will refer her to gastroenterology.    Arley Hof, MD  03/04/2024  11:14 AM

## 2024-03-04 NOTE — Progress Notes (Signed)
 Referrals placed for GYN Dr Cleotilde and return to [facility] Dr Saintclair

## 2024-03-10 ENCOUNTER — Encounter: Payer: Self-pay | Admitting: Gastroenterology

## 2024-04-09 ENCOUNTER — Ambulatory Visit: Admitting: Gastroenterology

## 2024-07-04 ENCOUNTER — Inpatient Hospital Stay: Attending: Oncology

## 2024-07-04 ENCOUNTER — Ambulatory Visit: Payer: Self-pay | Admitting: Oncology

## 2024-07-04 DIAGNOSIS — D509 Iron deficiency anemia, unspecified: Secondary | ICD-10-CM | POA: Insufficient documentation

## 2024-07-04 LAB — CBC WITH DIFFERENTIAL (CANCER CENTER ONLY)
Abs Immature Granulocytes: 0.01 K/uL (ref 0.00–0.07)
Basophils Absolute: 0 K/uL (ref 0.0–0.1)
Basophils Relative: 1 %
Eosinophils Absolute: 0.1 K/uL (ref 0.0–0.5)
Eosinophils Relative: 1 %
HCT: 32.2 % — ABNORMAL LOW (ref 36.0–46.0)
Hemoglobin: 10.3 g/dL — ABNORMAL LOW (ref 12.0–15.0)
Immature Granulocytes: 0 %
Lymphocytes Relative: 29 %
Lymphs Abs: 2 K/uL (ref 0.7–4.0)
MCH: 26.4 pg (ref 26.0–34.0)
MCHC: 32 g/dL (ref 30.0–36.0)
MCV: 82.6 fL (ref 80.0–100.0)
Monocytes Absolute: 0.5 K/uL (ref 0.1–1.0)
Monocytes Relative: 8 %
Neutro Abs: 4.1 K/uL (ref 1.7–7.7)
Neutrophils Relative %: 61 %
Platelet Count: 348 K/uL (ref 150–400)
RBC: 3.9 MIL/uL (ref 3.87–5.11)
RDW: 13.1 % (ref 11.5–15.5)
WBC Count: 6.7 K/uL (ref 4.0–10.5)
nRBC: 0 % (ref 0.0–0.2)

## 2024-07-04 LAB — FERRITIN: Ferritin: 14 ng/mL (ref 11–307)

## 2024-07-07 NOTE — Telephone Encounter (Signed)
-----   Message from Arley Hof, MD sent at 07/04/2024  3:21 PM EST ----- Please call patient, the hemoglobin is slightly lower,and the ferritin is low, be sure she is taking iron twice daily, call for symptoms of anemia, f/u as scheduled

## 2024-07-07 NOTE — Telephone Encounter (Signed)
 Attempted to call patient without success and unable to leave a voicemail message.

## 2024-11-04 ENCOUNTER — Ambulatory Visit: Admitting: Oncology

## 2024-11-04 ENCOUNTER — Other Ambulatory Visit
